# Patient Record
Sex: Male | Born: 1965
Health system: Southern US, Community
[De-identification: ages and names within clinical notes are randomized; demographics above are authoritative.]

## PROBLEM LIST (undated history)

## (undated) DIAGNOSIS — E876 Hypokalemia: Secondary | ICD-10-CM

## (undated) DIAGNOSIS — I10 Essential (primary) hypertension: Secondary | ICD-10-CM

## (undated) HISTORY — DX: Hypokalemia: E87.6

## (undated) HISTORY — PX: OTHER SURGICAL HISTORY: SHX169

## (undated) HISTORY — DX: Essential (primary) hypertension: I10

---

## 1997-12-14 ENCOUNTER — Emergency Department (HOSPITAL_COMMUNITY): Admission: EM | Admit: 1997-12-14 | Discharge: 1997-12-14 | Payer: Self-pay | Admitting: Emergency Medicine

## 2005-05-17 ENCOUNTER — Ambulatory Visit (HOSPITAL_BASED_OUTPATIENT_CLINIC_OR_DEPARTMENT_OTHER): Admission: RE | Admit: 2005-05-17 | Discharge: 2005-05-17 | Payer: Self-pay | Admitting: General Surgery

## 2008-03-30 ENCOUNTER — Emergency Department (HOSPITAL_COMMUNITY): Admission: EM | Admit: 2008-03-30 | Discharge: 2008-03-31 | Payer: Self-pay | Admitting: Emergency Medicine

## 2010-09-26 NOTE — Consult Note (Signed)
Nathan Ortega, Nathan Ortega NO.:  [id number]   MEDICAL RECORD NO.:  [id number]          PATIENT TYPE:  EMS   LOCATION:  [facility]                         FACILITY:  [facility]   PHYSICIAN:  Vania Rea, M.D. DATE OF BIRTH:  [birth year]   DATE OF CONSULTATION:  03/31/2008  DATE OF DISCHARGE:                                 CONSULTATION   REQUESTING PHYSICIAN:  Billee Cashing, M.D.   REASON FOR CONSULTATION:  Mental status changes.   HISTORY OF PRESENT ILLNESS:  This is a 45 year old Caucasian gentleman  who denies any significant medical history but has not had recent  medical followup.  Was in his baseline state of health until this  evening while after returning to work to get some extra work done.  Suddenly became confused.  Was talking to his wife on the phone and did  not know where he was or what he was doing at his location.  The  confusion continued for a few minutes, then he reverted back to normal.  He had no focal neurologic signs.  He had no visual disturbance.  There  was no nausea or vomiting.   Patient says he usually keeps good health.  He plays soccer regularly.  He has no history of head trauma.  He does report that 2-3 times over  the past 12 months he has had episodes where his vision seems to get  blurred, and he has a strange feeling of dizziness, as the best thing he  can describe it.  Usually only lasts for a few seconds.  Patient has  been brought to the hospital.  It has been noted he has returned  completely to normal.   PAST MEDICAL HISTORY:  None.   MEDICATIONS:  None.  Denies any over-the-counter or herbal medications.   ALLERGIES:  No known drug allergies.   SOCIAL HISTORY:  Denies tobacco or illicit drug use.  He very  occasionally takes a drink of alcohol.  Works as a Firefighter.   FAMILY HISTORY:  Significant for hypertension and CABG in his father and  his younger brother has diabetes.  No other family members with  diabetes.   REVIEW OF SYSTEMS:  Other than noted above, a 10-point review of systems  is unremarkable.   PHYSICAL EXAMINATION:  A healthy, muscular-looking young Caucasian  gentleman lying in the stretcher in no acute distress.  VITALS:  Initially, his temperature was 97.2, pulse 63, respirations 18,  blood pressure 161/103.  He was saturating 100% on room air.  He  received 0.3 mg of clonidine and currently, his blood pressure is  119/81.  His pulse is 52.  Pupils are equal, round and reactive.  Mucous membranes are pink and  anicteric.  He is not dehydrated.  He has no cervical lymphadenopathy or thyromegaly.  He has no carotid  bruit.  CHEST:  Clear to auscultation bilaterally.  CARDIOVASCULAR:  Regular rhythm without murmurs.  ABDOMEN:  Soft and nontender.  EXTREMITIES:  Without edema.  His muscle tone and bulk is good.  CENTRAL NERVOUS SYSTEM:  Cranial nerves II-XII  are grossly intact.  He  has no focal neurological deficit.   LABS:  CBC is unremarkable.  White count is 6.3, hemoglobin 17,  platelets 200.  He has a normal differential.  His serum chemistry is  remarkable for a potassium of 3.3, otherwise unremarkable.  His glucose  is 92.  His alcohol level is 6.  His cardiac enzymes are completely  normal.   His CT scan of the brain shows no acute abnormality.   His EKG shows sinus rhythm with a rate of 52.  Minimal voltage criteria,  LVH.   ASSESSMENT:  1. Transient global amnesia of short duration.  2. Hypertension of unclear duration.  3. Hypokalemia.   I have discussed this patient's transient amnesia with the neurologist  on call, Dr. Orlin Hilding, who feels he can be followed up as an outpatient.   PLAN:  We will give this gentleman a dose of oral potassium and  discharge him home with oral antihypertensives, to follow up with his  primary care physician.  This gentleman indicates that he has some  affiliation with [facility], and he will be following up with  Dr.  Neva Seat or Dr. Chevis Pretty.      Vania Rea, M.D.  Electronically Signed     LC/MEDQ  D:  03/31/2008  T:  03/31/2008  Job:  [id number]   cc:   Dr. Chevis Pretty  Dr. Neva Seat

## 2010-09-29 NOTE — Op Note (Signed)
NAMESADIEL, MOTA                 ACCOUNT NO.:  [id number]   MEDICAL RECORD NO.:  [id number]          PATIENT TYPE:  AMB   LOCATION:  [facility]                         FACILITY:  [facility]   PHYSICIAN:  Leonie Man, M.D.   DATE OF BIRTH:  [birth year]   DATE OF PROCEDURE:  05/17/2005  DATE OF DISCHARGE:  05/17/2005                                 OPERATIVE REPORT   PREOPERATIVE DIAGNOSIS:  Anal fistula.   POSTOPERATIVE DIAGNOSIS:  Anal fistula.   PROCEDURE:  Fistulotomy and marsupialization   SURGEON:  Leonie Man, M.D.   ASSISTANT:  None.   ANESTHESIA:  General.   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 He has returned now with an anal fistula. On  probing of the fistula, this does not go across the sphincteric muscles to  any degree on examination. The fistula is above the equatorial plane  anteriorly in the lithotomy position. The patient comes to the operating  room now for fistulotomy.   DESCRIPTION OF PROCEDURE:  Following the induction of satisfactory general  anesthesia, the patient is placed in the lithotomy position and the perianal  tissues were prepped and draped, to be included in a sterile operative  field. I entered a probe into the fistula and injected some peroxide into  the fistulous tract. The fistula was noted to enter the anus just at the  region of the dentate line. After infiltrating the area around fistula with  0.25% Marcaine, the fistula was opened. There was an anterior branch going  up and then the main branch of the fistula entered directly into the anus.  This was opened up in its entirety and curetted clean. All hemostasis was  obtained with electrocautery; and the edges of the fistula tract was sewn  down to the skin with a running 3-0 chromic catgut suture.  I packed the  incision with Xeroform gauze, and applied a sterile dressing.   The patient was then recovered and removed from  the operating room to the  recovery room in stable condition. He tolerated the procedure well.      Leonie Man, M.D.  Electronically Signed     PB/MEDQ  D:  06/05/2005  T:  06/05/2005  Job:  [id number]

## 2011-02-14 LAB — BASIC METABOLIC PANEL
BUN: 11
Calcium: 9.7
Creatinine, Ser: 1.08
GFR calc Af Amer: >60
Glucose, Bld: 92
Sodium: 141

## 2011-02-14 LAB — ETHANOL: Alcohol, Ethyl (B): 6

## 2011-02-14 LAB — RAPID URINE DRUG SCREEN, HOSP PERFORMED
Barbiturates: NOT DETECTED
Benzodiazepines: NOT DETECTED
Cocaine: NOT DETECTED
Tetrahydrocannabinol: NOT DETECTED

## 2011-02-14 LAB — CBC
MCHC: 35
Platelets: 200
RDW: 12.8

## 2011-02-14 LAB — DIFFERENTIAL
Basophils Relative: 1
Monocytes Absolute: 0.6
Monocytes Relative: 9

## 2011-02-14 LAB — POCT CARDIAC MARKERS: Myoglobin, poc: 64.7

## 2013-01-22 ENCOUNTER — Other Ambulatory Visit: Payer: Self-pay | Admitting: Orthopedic Surgery

## 2013-01-22 DIAGNOSIS — M25561 Pain in right knee: Secondary | ICD-10-CM

## 2013-01-23 ENCOUNTER — Other Ambulatory Visit: Payer: Self-pay

## 2013-01-23 ENCOUNTER — Ambulatory Visit
Admission: RE | Admit: 2013-01-23 | Discharge: 2013-01-23 | Disposition: A | Discharge status: Home / Self Care | Payer: 59 | Source: Ambulatory Visit | Attending: Orthopedic Surgery | Admitting: Orthopedic Surgery

## 2013-01-23 DIAGNOSIS — M25561 Pain in right knee: Secondary | ICD-10-CM

## 2013-11-04 ENCOUNTER — Encounter: Payer: Self-pay | Admitting: Cardiology

## 2013-11-04 ENCOUNTER — Encounter: Payer: Self-pay | Admitting: *Deleted

## 2013-11-04 DIAGNOSIS — E876 Hypokalemia: Secondary | ICD-10-CM | POA: Insufficient documentation

## 2013-11-04 DIAGNOSIS — I10 Essential (primary) hypertension: Secondary | ICD-10-CM | POA: Insufficient documentation

## 2015-10-19 ENCOUNTER — Other Ambulatory Visit: Payer: Self-pay | Admitting: Orthopedic Surgery

## 2015-10-19 DIAGNOSIS — R531 Weakness: Secondary | ICD-10-CM

## 2015-10-19 DIAGNOSIS — M25561 Pain in right knee: Secondary | ICD-10-CM

## 2015-10-19 DIAGNOSIS — R609 Edema, unspecified: Secondary | ICD-10-CM

## 2015-10-25 ENCOUNTER — Ambulatory Visit
Admission: RE | Admit: 2015-10-25 | Discharge: 2015-10-25 | Disposition: A | Discharge status: Home / Self Care | Payer: 59 | Source: Ambulatory Visit | Attending: Orthopedic Surgery | Admitting: Orthopedic Surgery

## 2015-10-25 DIAGNOSIS — R609 Edema, unspecified: Secondary | ICD-10-CM

## 2015-10-25 DIAGNOSIS — R531 Weakness: Secondary | ICD-10-CM

## 2015-10-25 DIAGNOSIS — M25561 Pain in right knee: Secondary | ICD-10-CM

## 2016-02-01 ENCOUNTER — Encounter: Payer: 59 | Attending: Internal Medicine | Admitting: Dietician

## 2016-02-01 DIAGNOSIS — I10 Essential (primary) hypertension: Secondary | ICD-10-CM | POA: Insufficient documentation

## 2016-02-01 DIAGNOSIS — Z713 Dietary counseling and surveillance: Secondary | ICD-10-CM | POA: Insufficient documentation

## 2016-02-01 DIAGNOSIS — E785 Hyperlipidemia, unspecified: Secondary | ICD-10-CM | POA: Diagnosis not present

## 2016-02-01 NOTE — Progress Notes (Signed)
  Medical Nutrition Therapy:  Appt start time: 0950 end time:  1100.   Assessment:  Primary concerns today: Nathan Ortega is here today since he has a hx high blood pressure and 2 months ago it was 190/120 when he wasn't taking his medication. Has been off and on medication for the past few years. Started taking medication again at the end of July. Sometimes will have migraines with high blood pressure. Has made a lot of changes to his diet in the past month. Would like to come up with a meal plan that works for him. Used to go out to eat for lunch a lot. Has been eating mostly salad, yogurt, and fruit for lunch at work.   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 Lives with his wife, 48 year old son, and 2 sons in college who are not at home. Oldest son fends for himself for food. His wife does the food shopping and meal preparation at home. Usually does not eat breakfast during the week. Eating out 1-2 meals per week. Was eating out most days for lunch fast food and [employer].   Has cut back on salt with cooking.   Had ACL surgery 3 years ago and was playing soccer before.   Preferred Learning Style:   No preference indicated   Learning Readiness:   Ready   MEDICATIONS: see list   DIETARY INTAKE:  Usual eating pattern includes 2-3 meals and 2 snacks per day.  Avoided foods include some fish, mushrooms, liver     24-hr recall:  B ( AM): none or frosted mini wheats/honey nut or oatmeal on weekends  Snk ( AM): none  L ( PM): salads with vegetables and vinaigrette or Newman's own and non fat [place] yogurt and sometimes blueberries  Snk ( PM): banana or lightly salted peanuts or triscuits  D ( PM): grilled chicken or steak or fish or tilapia with salad and fruit Snk ( PM): banana or lightly salted peanuts or triscuits  Beverages: coffee and water  Usual physical activity: gym inconsistent a few nights per week, walks at night  Estimated energy needs: 2000 calories 225 g  carbohydrates 150 g protein 56 g fat  Progress Towards Goal(s):  In progress.   Nutritional Diagnosis:  NB-1.1 Food and nutrition-related knowledge deficit As related to hypertension.  As evidenced by hx of eating excessive sodium containing foods.    Intervention:  Nutrition counseling provided. Plan: Try unsalted peanut butter  Fat from plants (avocado, olive oil, seeds, nuts) are more heart healthy Try 2% yogurt.  Try Olive oil store for salad ideas.  Limit egg yolks to no more than 7 per week.  Choose lower sodium lunch meat. Plan to make lunch at night. Add protein to salad - egg, lower sodium lunch meat, lower sodium beans, grilled meat.  Teaching Method Utilized:  Visual Auditory Hands on  Handouts given during visit include:  Hypertension Nutrition Therapy Handout   Barriers to learning/adherence to lifestyle change: none  Demonstrated degree of understanding via:  Teach Back   Monitoring/Evaluation:  Dietary intake, exercise, and body weight prn.

## 2016-02-01 NOTE — Patient Instructions (Addendum)
Try unsalted peanut butter  Fat from plants (avocado, olive oil, seeds, nuts) are more heart healthy Try 2% yogurt.  Try Olive oil store for salad ideas.  Limit egg yolks to no more than 7 per week.  Choose lower sodium lunch meat. Plan to make lunch at night. Add protein to salad - egg, lower sodium lunch meat, lower sodium beans, grilled meat.

## 2017-11-03 IMAGING — MR MR KNEE*R* W/O CM
4 of 6 series · 18 of 40 positions shown · non-contrast
Comparison: MRI 01/23/2013.

CLINICAL DATA: Knee pain with swelling and instability after
bowling 2 months ago. Previous ACL reconstruction in 2900.

EXAM:
MRI OF THE RIGHT KNEE WITHOUT CONTRAST
TECHNIQUE: Multiplanar, multisequence MR imaging of the knee was performed. No
intravenous contrast was administered.

[Series 3: PD fat-sat · axial · 4.0mm · 0.31mm/px · z∈[-57,+48]mm · 8 of 25 slices shown (1 of 4)]
[im 1/25]
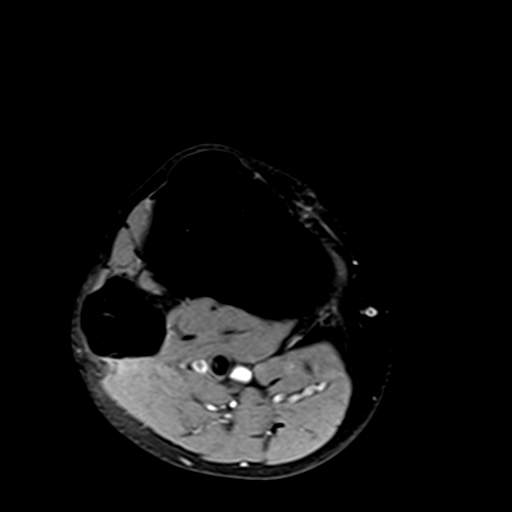
[im 4/25]
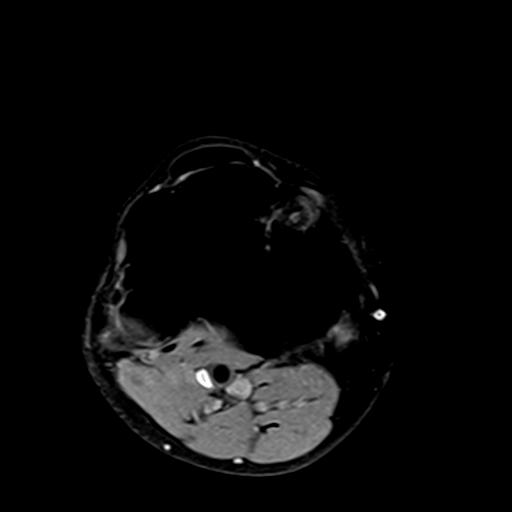
[im 7/25]
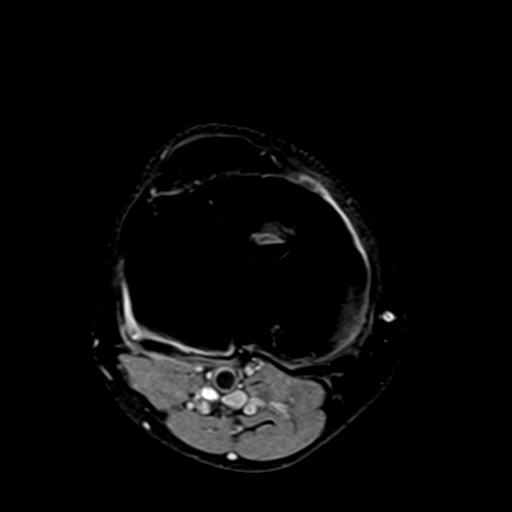
[im 11/25]
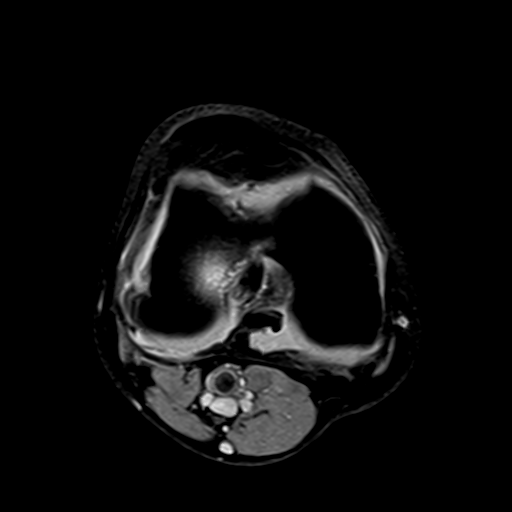
[im 14/25]
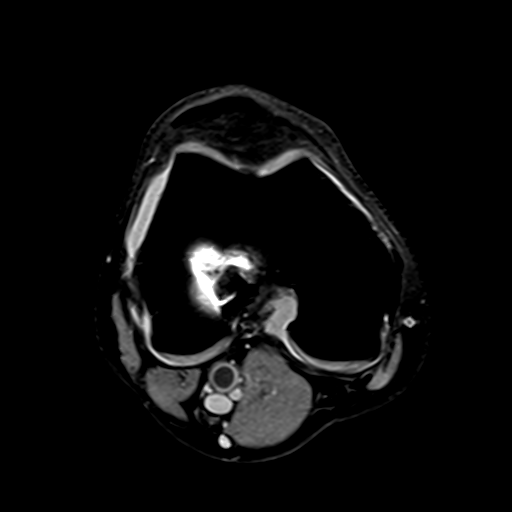
[im 18/25]
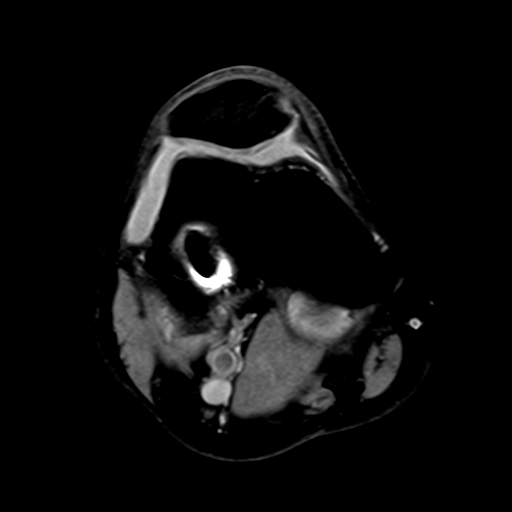
[im 21/25]
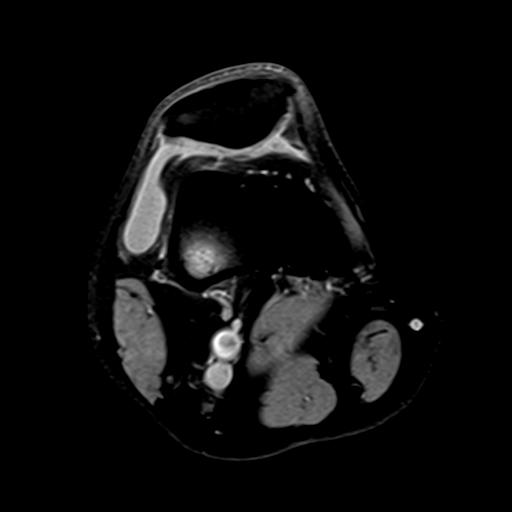
[im 25/25]
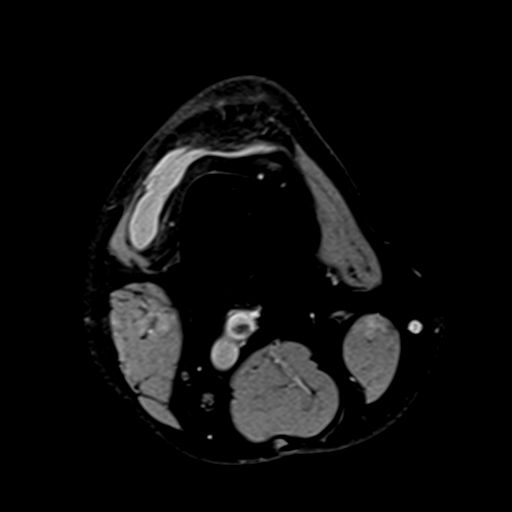

[Series 6: PD fat-sat · coronal · 3.5mm · 0.31mm/px · 4 of 25 slices shown (2 of 4)]
[im 1/25]
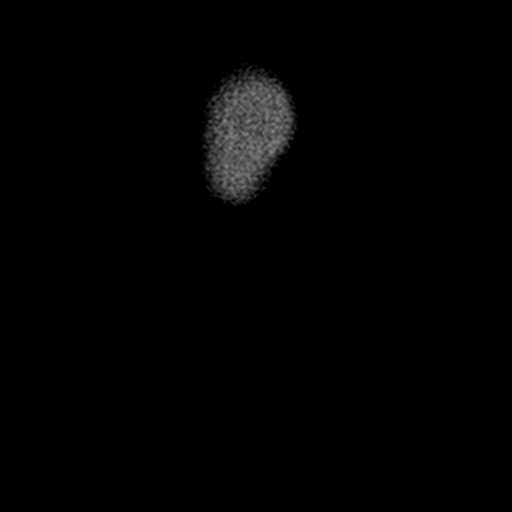
[im 5/25]
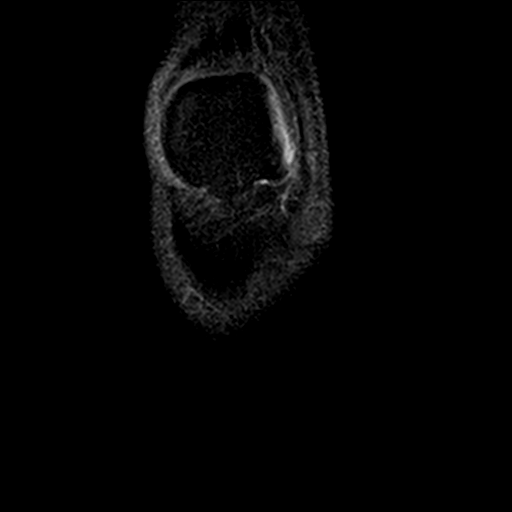
[im 13/25]
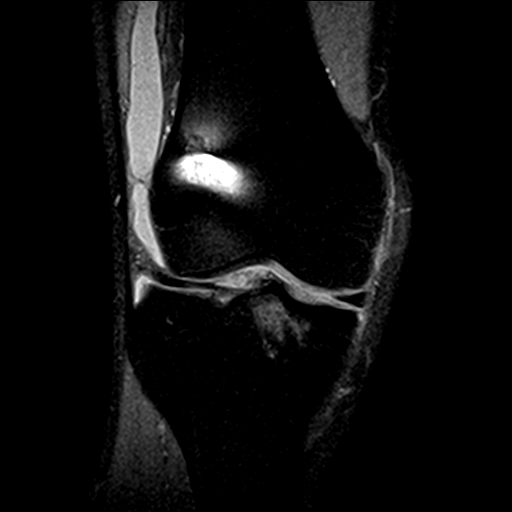
[im 21/25]
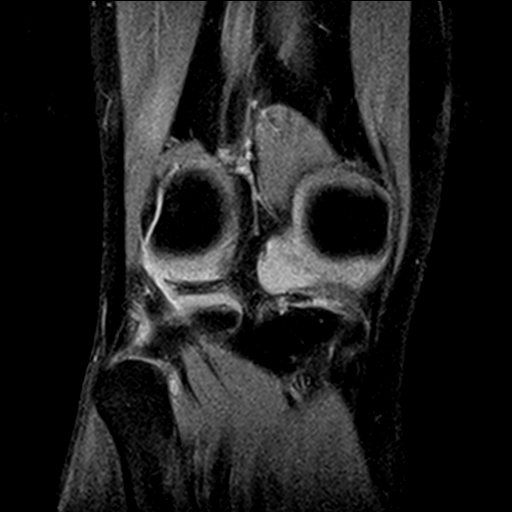

[Series 7: PD fat-sat · sagittal · 3.5mm · 0.31mm/px · 3 of 27 slices shown (3 of 4)]
[im 4/27]
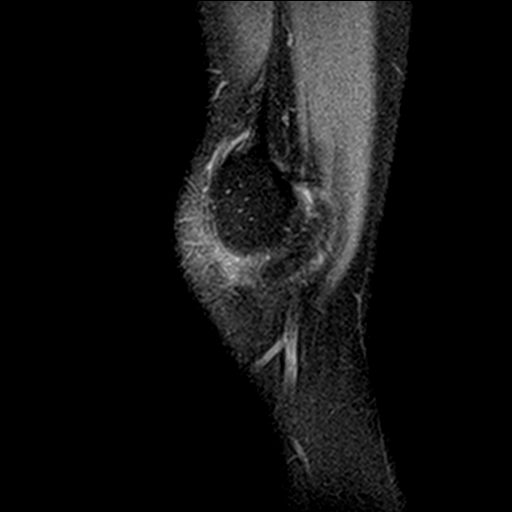
[im 15/27]
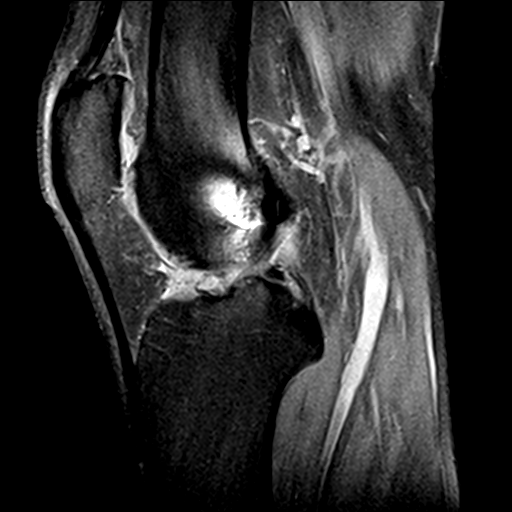
[im 23/27]
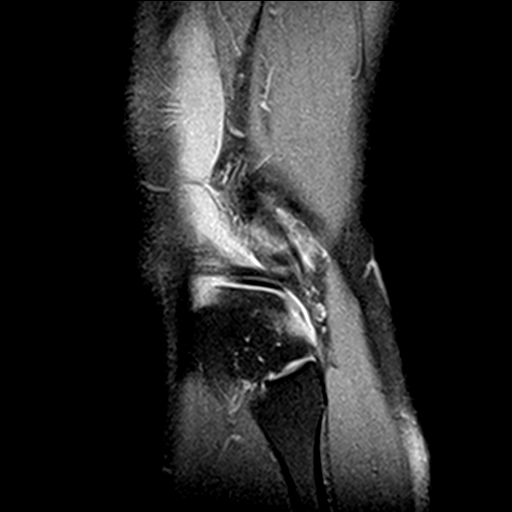

[Series 8: PD fat-sat · coronal · 2.0mm · 0.29mm/px · 3 of 11 slices shown (4 of 4)]
[im 1/11]
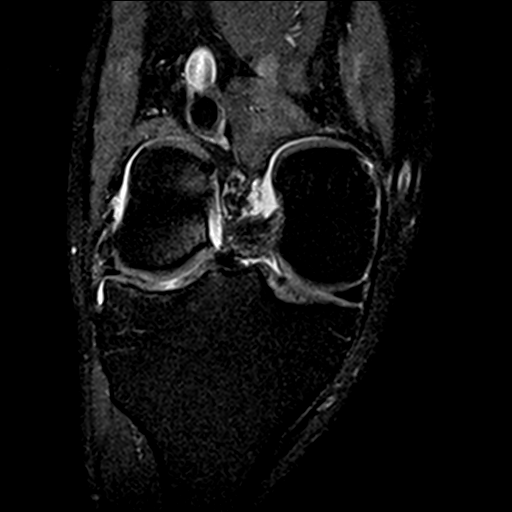
[im 6/11]
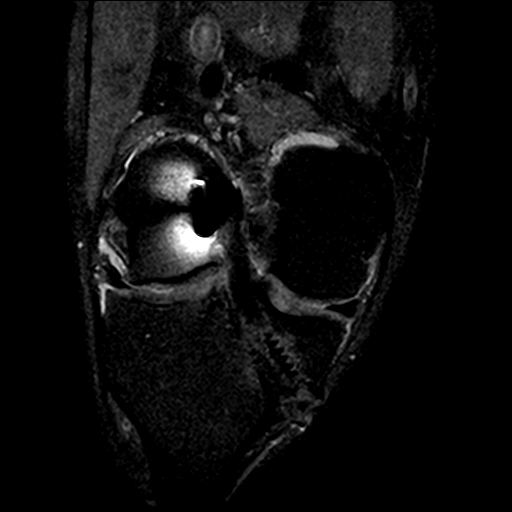
[im 11/11]
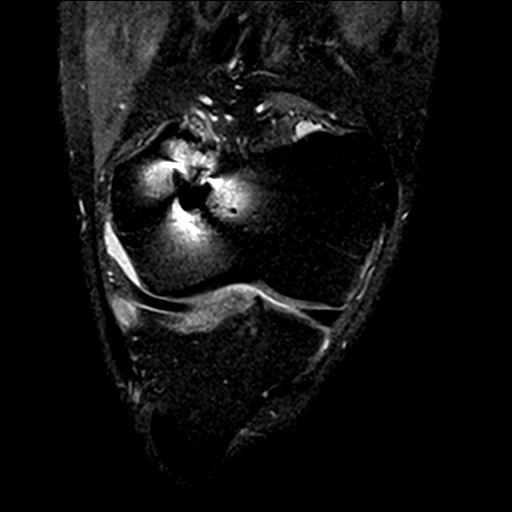

[18 of 40 positions shown; findings below may reference images not displayed]

FINDINGS: MENISCI

Medial meniscus:  Intact with normal morphology.

Lateral meniscus: Interval debridement of bucket-handle tear
previously demonstrated. No recurrent meniscal tear or displaced
meniscal fragment identified.

LIGAMENTS

Cruciates: Interval ACL reconstruction. The ACL graft is intact. The
PCL is intact with mild posterior buckling.

Collaterals:  Intact.

CARTILAGE

Patellofemoral: Multifocal patellofemoral chondral thinning and
surface irregularity, similar to the previous examination.

Medial:  Stable mild chondral thinning without focal defect.

Lateral: There is focal full-thickness chondral thinning over the
central aspect of the lateral tibial plateau, best seen on coronal
images. No significant subchondral signal abnormalities.

OTHER

Joint: Small to moderate joint effusion. There is an apparent
fragmented osteophyte of the medial tibial plateau posteriorly
accounting for a suggested loose body on axial image 18 and coronal
image 5.

Popliteal Fossa:  Unremarkable. No significant Baker's cyst.

Extensor Mechanism: Intact. The patellar tendon appears normal, not
utilized for the ACL graft.

Bones:  No acute or significant extra-articular osseous findings.
IMPRESSION: 1. No acute findings or evidence of internal derangement.
2. The ACL graft is intact status post interval ACL reconstruction.
3. No evidence of recurrent meniscal tear following debridement of
previously demonstrated lateral meniscal bucket-handle tear.
4. Tricompartmental degenerative chondrosis, similar to previous
study. There is a full-thickness chondral defect over the central
aspect of the lateral tibial plateau. There is fragmented spurring
of the medial tibial plateau posteriorly.

## 2020-01-21 ENCOUNTER — Ambulatory Visit (INDEPENDENT_AMBULATORY_CARE_PROVIDER_SITE_OTHER): Payer: 59 | Admitting: Mental Health

## 2020-01-21 ENCOUNTER — Other Ambulatory Visit: Payer: Self-pay

## 2020-01-21 DIAGNOSIS — F4322 Adjustment disorder with anxiety: Secondary | ICD-10-CM

## 2020-01-21 NOTE — Progress Notes (Signed)
[facility] Initial Adult Exam  Name: Nathan Ortega Date: 01/21/2020 MRN: [id number] DOB: 1965-08-16 PCP: Nathan Noble, MD  Time spent: 53 minutes  Reason for Visit /Presenting Problem: Patient stated he and his wife had their 21 year anniversary. He stated she caught him viewing pictures of women online via social media. The anniversary trip difficult on the way home, had car problems which made the trip home even more difficult. She now feels he is untrustworthy. He stated she has been having some "male issues" which made her feel like this is a reason why he engaged in the behavior, which lasted for a year period. Currently, she remains disgusted and upset w/ his behavior. He views himself as more of a risk taker She knows he avoids conflict and is not always direct enough. He struggles to be fully honest about his feelings.   Mental Status Exam:   Appearance:   Casual     Behavior:  Appropriate  Motor:  Normal  Speech/Language:   Clear/coherant  Affect:  Full range  Mood:  Anxious, pleasant  Thought process:  normal  Thought content:    WNL  Sensory/Perceptual disturbances:    WNL  Orientation:  x4  Attention:  Good  Concentration:  Good  Memory:  WNL  Fund of knowledge:   Good  Insight:    Good  Judgment:   Good  Impulse Control:  Good   Reported Symptoms:  Some anxiety and sad feelings  Risk Assessment: Danger to Self:  No Self-injurious Behavior: No Danger to Others: No Duty to Warn:no Physical Aggression / Violence:No  Access to Firearms a concern: No  Gang Involvement:No  Patient / guardian was educated about steps to take if suicide or homicide risk level increases between visits: yes While future psychiatric events cannot be accurately predicted, the patient does not currently require acute inpatient psychiatric care and does not currently meet [facility] involuntary commitment criteria.  Substance Abuse History: Current substance abuse: No      Past Psychiatric History:   Outpatient Providers: none History of Psych Hospitalization: No  Psychological Testing: none  Abuse History: Victim -none Report needed: No. Victim of Neglect:No. Perpetrator of none  Witness / Exposure to Domestic Violence: No   Protective Services Involvement: No  Witness to [employer] Violence:  No    Medical History/Surgical History:  Past Medical History:  Diagnosis Date  . HTN (hypertension)   . Hypokalemia      Medications: Current Outpatient Medications  Medication Sig Dispense Refill  . amLODipine (NORVASC) 10 MG tablet Take 15 mg by mouth daily.    Marland Kitchen triamterene-hydrochlorothiazide (MAXZIDE-25) 37.5-25 MG tablet Take 1 tablet by mouth daily.     No current facility-administered medications for this visit.    Not on File  Diagnoses:    ICD-10-CM   1. Adjustment disorder with anxious mood  F43.22     Plan of Care: TBD   Nathan Ortega, [facility]

## 2020-02-09 ENCOUNTER — Other Ambulatory Visit: Payer: Self-pay

## 2020-02-09 ENCOUNTER — Ambulatory Visit (INDEPENDENT_AMBULATORY_CARE_PROVIDER_SITE_OTHER): Payer: 59 | Admitting: Mental Health

## 2020-02-09 DIAGNOSIS — F4322 Adjustment disorder with anxiety: Secondary | ICD-10-CM | POA: Diagnosis not present

## 2020-02-09 NOTE — Progress Notes (Signed)
[facility] Psychotherapy Note   Name: Nathan Ortega Date: 02/09/2020 MRN: [id number] DOB: [birth year] PCP: Marden Noble, MD  Time spent: 53 minutes  Treatment: individual therapy  Mental Status Exam:   Appearance:   Casual     Behavior:  Appropriate  Motor:  Normal  Speech/Language:   Clear/coherant  Affect:  Full range  Mood:  Anxious, pleasant  Thought process:  normal  Thought content:    WNL  Sensory/Perceptual disturbances:    WNL  Orientation:  x4  Attention:  Good  Concentration:  Good  Memory:  WNL  Fund of knowledge:   Good  Insight:    Good  Judgment:   Good  Impulse Control:  Good   Reported Symptoms:  Some anxiety and sad feelings  Risk Assessment: Danger to Self:  No Self-injurious Behavior: No Danger to Others: No Duty to Warn:no Physical Aggression / Violence:No  Access to Firearms a concern: No  Gang Involvement:No  Patient / guardian was educated about steps to take if suicide or homicide risk level increases between visits: yes While future psychiatric events cannot be accurately predicted, the patient does not currently require acute inpatient psychiatric care and does not currently meet [facility] involuntary commitment criteria.  Substance Abuse History: Current substance abuse: No     Past Psychiatric History:   Outpatient Providers: none History of Psych Hospitalization: No  Psychological Testing: none  Abuse History: Victim -none Report needed: No. Victim of Neglect:No. Perpetrator of none  Witness / Exposure to Domestic Violence: No   Protective Services Involvement: No  Witness to [employer] Violence:  No    Medical History/Surgical History:  Past Medical History:  Diagnosis Date  . HTN (hypertension)   . Hypokalemia      Medications: Current Outpatient Medications  Medication Sig Dispense Refill  . amLODipine (NORVASC) 10 MG tablet Take 15 mg by mouth daily.    Marland Kitchen triamterene-hydrochlorothiazide (MAXZIDE-25)  37.5-25 MG tablet Take 1 tablet by mouth daily.     No current facility-administered medications for this visit.    Family History: Raised by both parents. 1 brother- age 63. Pleasant childhood. Father -hx of etoh /opioid abuse. Has been sober for almost 10 years.  Close to his parents.  Works w/ his father.   Social History:  Social History   Socioeconomic History  . Marital status: Married    Spouse name: Not on file  . Number of children: Not on file  . Years of education: Not on file  . Highest education level: Not on file  Occupational History  . Not on file  Tobacco Use  . Smoking status: Not on file  Substance and Sexual Activity  . Alcohol use: Not on file  . Drug use: Not on file  . Sexual activity: Not on file  Other Topics Concern  . Not on file  Social History Narrative  . Not on file   Social Determinants of Health   Financial Resource Strain:   . Difficulty of Paying Living Expenses: Not on file  Food Insecurity:   . Worried About Programme researcher, broadcasting/film/video in the Last Year: Not on file  . Ran Out of Food in the Last Year: Not on file  Transportation Needs:   . Lack of Transportation (Medical): Not on file  . Lack of Transportation (Non-Medical): Not on file  Physical Activity:   . Days of Exercise per Week: Not on file  . Minutes of Exercise per Session: Not on file  Stress:   . Feeling of Stress : Not on file  Social Connections:   . Frequency of Communication with Friends and Family: Not on file  . Frequency of Social Gatherings with Friends and Family: Not on file  . Attends Religious Services: Not on file  . Active Member of Clubs or Organizations: Not on file  . Attends Banker Meetings: Not on file  . 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  He stated his wife told he she is still mad at him; they are working on building trust in the relationship again.  He stated that they communicate day-to-day with one another but there continues to be relational strain due to his not being honest with her about some of his online activities as discussed initially.  He continues to report he has refrained from these behaviors and went on to share some personality trait differences between him and his wife.  He verbalized understanding why she is upset with him and that his choices were not reflective of something he is proud of or would do again.  Through guided discovery, he verbalized his difficulty at times with identifying and processing his feelings openly.  This is evident in various relationships but also present in his marital relationship at times.  Facilitated his identifying some thoughts and feelings related to how he was feeling in their relationship which was in part connected to some of his choices that led to his online behaviors.  He was able to identify the benefit of potentially communicating with his wife some of these thoughts and feelings at the right time.  Challenged him to consider what he believes and/or tells himself that has been a catalyst for the suppression of feelings at times.  He plans to give  this thought and consideration between sessions.  Interventions: Assessment, CBT, supportive therapy  Diagnoses:    ICD-10-CM   1. Adjustment disorder with anxious mood  F43.22     Plan: Patient is to use CBT, mindfulness and coping skills to  Decrease anxiety.    Patient to continue to maintain abstinence from viewing online material with which he and for his wife might consider inappropriate.   Long-term goal:   Reduce overall level, frequency, and intensity of the feelings of anxiety.  Patient to rebuild trust with his wife in their marital relationship.   Short-term goal:  Maintain honesty in his marital relationship to build trust again. Improve communication in his marital relationship evidenced by sharing thoughts and feelings as opposed to suppressing them Increase insight toward further understanding thoughts and behaviors which led to a decrease in trust in his marital relationship  Assessment of progress:  progressing   Waldron Session, [facility]

## 2020-03-08 ENCOUNTER — Ambulatory Visit: Payer: 59 | Admitting: Mental Health

## 2020-03-30 ENCOUNTER — Ambulatory Visit (INDEPENDENT_AMBULATORY_CARE_PROVIDER_SITE_OTHER): Payer: 59 | Admitting: Mental Health

## 2020-03-30 ENCOUNTER — Other Ambulatory Visit: Payer: Self-pay

## 2020-03-30 DIAGNOSIS — F4322 Adjustment disorder with anxiety: Secondary | ICD-10-CM | POA: Diagnosis not present

## 2020-03-30 NOTE — Progress Notes (Signed)
[facility] Psychotherapy Note   Name: Nathan Ortega Date: 03/30/2020 MRN: [id number] DOB: 10-Jan-1966 PCP: Marden Noble, MD  Time spent: 53 minutes  Treatment: individual therapy  Mental Status Exam:   Appearance:   Casual     Behavior:  Appropriate  Motor:  Normal  Speech/Language:   Clear/coherant  Affect:  Full range  Mood:  Anxious, pleasant  Thought process:  normal  Thought content:    WNL  Sensory/Perceptual disturbances:    WNL  Orientation:  x4  Attention:  Good  Concentration:  Good  Memory:  WNL  Fund of knowledge:   Good  Insight:    Good  Judgment:   Good  Impulse Control:  Good   Reported Symptoms:  Some anxiety and sad feelings  Risk Assessment: Danger to Self:  No Self-injurious Behavior: No Danger to Others: No Duty to Warn:no Physical Aggression / Violence:No  Access to Firearms a concern: No  Gang Involvement:No  Patient / guardian was educated about steps to take if suicide or homicide risk level increases between visits: yes While future psychiatric events cannot be accurately predicted, the patient does not currently require acute inpatient psychiatric care and does not currently meet [facility] involuntary commitment criteria. Medications: Current Outpatient Medications  Medication Sig Dispense Refill  . amLODipine (NORVASC) 10 MG tablet Take 15 mg by mouth daily.    Marland Kitchen triamterene-hydrochlorothiazide (MAXZIDE-25) 37.5-25 MG tablet Take 1 tablet by mouth daily.     No current facility-administered medications for this visit.    Subjective: Patient presents for session on time.  He shared progress, recent events.  He stated his wife is been under increased pressure recently, increased stress due to her job.  He went on to detail a situation with which she is coping with at length as well as his attempts to provide support to her during this time.  He feels that the relationship has continued to improve over the past few weeks,  albeit she has been largely focused on her job stress.  Through guided discovery, he identified the need to have a more focused discussion about some of the relationship issues with which he feels he has been responsible.  He plans to take ownership of some of his behaviors and we explore collaboratively ways he can communicate his feelings as this is been challenging for him in the past.  He identified how also making this effort would also illustrate to his wife and for himself that he is working to be more communicative of his feelings.   Interventions: Solution focused approaches, supportive therapy  Diagnoses:    ICD-10-CM   1. Adjustment disorder with anxious mood  F43.22     Plan: Patient is to use CBT, mindfulness and coping skills to  Decrease anxiety.    Patient to continue to maintain abstinence from viewing online material with which he and for his wife might consider inappropriate.   Long-term goal:   Reduce overall level, frequency, and intensity of the feelings of anxiety.  Patient to rebuild trust with his wife in their marital relationship.   Short-term goal:  Maintain honesty in his marital relationship to build trust again. Improve communication in his marital relationship evidenced by sharing thoughts and feelings as opposed to suppressing them Increase insight toward further understanding thoughts and behaviors which led to a decrease in trust in his marital relationship   Assessment of progress:  progressing   Waldron Session, [facility]

## 2020-04-28 ENCOUNTER — Ambulatory Visit: Payer: 59 | Admitting: Mental Health

## 2021-03-21 ENCOUNTER — Encounter (HOSPITAL_BASED_OUTPATIENT_CLINIC_OR_DEPARTMENT_OTHER): Payer: Self-pay | Admitting: Cardiovascular Disease

## 2021-03-21 ENCOUNTER — Ambulatory Visit (INDEPENDENT_AMBULATORY_CARE_PROVIDER_SITE_OTHER): Payer: 59 | Admitting: Cardiovascular Disease

## 2021-03-21 ENCOUNTER — Other Ambulatory Visit: Payer: Self-pay

## 2021-03-21 VITALS — BP 128/86 | HR 70 | Ht 70.0 in | Wt 170.7 lb

## 2021-03-21 DIAGNOSIS — I10 Essential (primary) hypertension: Secondary | ICD-10-CM | POA: Diagnosis not present

## 2021-03-21 DIAGNOSIS — I739 Peripheral vascular disease, unspecified: Secondary | ICD-10-CM

## 2021-03-21 DIAGNOSIS — E7800 Pure hypercholesterolemia, unspecified: Secondary | ICD-10-CM | POA: Diagnosis not present

## 2021-03-21 DIAGNOSIS — R55 Syncope and collapse: Secondary | ICD-10-CM | POA: Insufficient documentation

## 2021-03-21 DIAGNOSIS — H532 Diplopia: Secondary | ICD-10-CM

## 2021-03-21 HISTORY — DX: Pure hypercholesterolemia, unspecified: E78.00

## 2021-03-21 HISTORY — DX: Peripheral vascular disease, unspecified: I73.9

## 2021-03-21 HISTORY — DX: Syncope and collapse: R55

## 2021-03-21 HISTORY — DX: Diplopia: H53.2

## 2021-03-21 MED ORDER — AMLODIPINE BESYLATE 10 MG PO TABS: 10.0000 mg | ORAL_TABLET | Freq: Every day | ORAL | 3 refills | Status: DC

## 2021-03-21 NOTE — Assessment & Plan Note (Signed)
He has made significant lifestyle changes in the last 3 months.  We will check fasting lipids and a CMP today.  Given that he has PAD, his LDL goal should be less than 70.  We discussed the fact that it is very likely we will recommend a statin and he expressed understanding.

## 2021-03-21 NOTE — Patient Instructions (Addendum)
Medication Instructions:  INCREASE AMLODIPINE TO 10 MG DAILY   STOP METOPROLOL   *If you need a refill on your cardiac medications before your next appointment, please call your pharmacy*  Lab Work: LP/CMET TODAY   If you have labs (blood work) drawn today and your tests are completely normal, you will receive your results only by: MyChart Message (if you have MyChart) OR A paper copy in the mail If you have any lab test that is abnormal or we need to change your treatment, we will call you to review the results.  Testing/Procedures: Your physician has requested that you have a carotid duplex. This test is an ultrasound of the carotid arteries in your neck. It looks at blood flow through these arteries that supply the brain with blood. Allow one hour for this exam. There are no restrictions or special instructions.  Follow-Up: At [facility], you and your health needs are our priority.  As part of our continuing mission to provide you with exceptional heart care, we have created designated Provider Care Teams.  These Care Teams include your primary Cardiologist (physician) and Advanced Practice Providers (APPs -  Physician Assistants and Nurse Practitioners) who all work together to provide you with the care you need, when you need it.  We recommend signing up for the patient portal called "MyChart".  Sign up information is provided on this After Visit Summary.  MyChart is used to connect with patients for Virtual Visits (Telemedicine).  Patients are able to view lab/test results, encounter notes, upcoming appointments, etc.  Non-urgent messages can be sent to your provider as well.   To learn more about what you can do with MyChart, go to [email or web address].    Your next appointment:   2 month(s)  The format for your next appointment:   In Person  Provider:   DR Duke Salvia   You have been referred to NEUROLOGY   Where: [facility] Address: [address] [place] [address] Phone: [phone]  IF YOU DO NOT HEAR FROM THEM YOU CAN CALL THE OFFICE DIRECTLY AT NUMBER LISTED ABOVE  Other Instructions MONITOR YOUR BLOOD PRESSURE DAILY   BRING YOUR READINGS AND MACHINE TO YOUR FOLLOW UP APPOINTMENT

## 2021-03-21 NOTE — Assessment & Plan Note (Signed)
He has random episodes of diplopia.  He is also had transient global amnesia at least twice.  He had a stat head CT but no carotid Dopplers.  We will check carotid Dopplers.  I will also have him see neurology to see if they have any other work-up recommendations.

## 2021-03-21 NOTE — Assessment & Plan Note (Signed)
He had an isolated episode of syncope that occurred after making significant changes to his diet.  I suspect this was related to intravascular volume depletion.  He has no symptoms lately and there were no preceding symptoms such as chest pain, palpitations, or shortness of breath.  We will continue to monitor for now.

## 2021-03-21 NOTE — Assessment & Plan Note (Signed)
BP has been more elevated at home.  Better in the office today than usual.  He is interested on being on his few medicines as possible.  He has been seen at [facility] for renal denervation study.  He is not yet sure if he will be enrolled.  We discussed the fact that there are no likely secondary causes for his hypertension.  He has a healthy body weight and exercises regularly.  He is also been working on his diet.  He snores but has no other symptoms of sleep apnea.  He thinks his thyroid has been checked with his PCP.  CT showed no renal artery stenosis and no concerns for adrenal adenomas.  We will try to simplify his regimen by increasing amlodipine to 10 mg and stopping metoprolol.  He will start tracking his blood pressures regularly and bring that to follow-up.  We will also ask him to bring his machine to make sure that it is accurate.  Continue working on diet and exercise.

## 2021-03-21 NOTE — Assessment & Plan Note (Addendum)
Calcification of the iliacs noted on CT 11/2020.  He has no claudication and has good peripheral pulses.  LDL goal should be less than 70.

## 2021-03-21 NOTE — Progress Notes (Signed)
Cardiology Office Note:    Date:  03/21/2021   ID:  Nathan Ortega, DOB 1965/12/01, MRN [id number]  PCP:  Marden Noble, MD   [facility] Providers Cardiologist:  None     Referring MD: Marden Noble, MD   No chief complaint on file.   History of Present Illness:    Nathan Ortega is a 55 y.o. male with a hx of hypertension, hyperlipidemia, and hypokalemia, here for evaluation of history of LVH, hypertension, and aortic root dilation. He had a CT abdomen/pelvis 11/2020 that showed mild atherosclerosis in the iliac arteries.  Today, he feels his blood pressure has been elevated for a long time and has been difficult to control with medication. After a previous episode of global transient amnesia (2009), he was initially placed on antihypertensives. Metoprolol was added in the past year. Previously he was on an ace inhibitor but this caused a severe cough. At home his blood pressure is as high as 160/90-100. He does not monitor this regularly, and is unsure of his average blood pressure. However, during clinic visits he usually has readings in the 140s-160s. Of note, he reports having a fainting spell this past September. After having steak and one alcoholic drink at dinner, he began to "feel horrible" with associated nausea. He then passed out on the way home. At this time he denies any recurrent syncopal episodes, but does have similar pre-syncopal episodes that occur about every 3-4 weeks. He experiences "blanking out", double vision, and a feeling of "everything spinning around him" for a duration of up to 3 minutes. This may occur while sitting at the office, walking his dog, or driving. About a month ago his neck began hurting. For about a week he was unable to turn his head to the side, but this has currently resolved. For exercise he walks every day, and enjoys hiking on the weekends. Generally he feels fine aside from shortness of breath with steep inclines. In the past 3 months he has  incorporated more dietary changes and is working on increasing his exercise. He has been moving towards a plant-based diet with more fruits and vegetables, but has meat about once a week. At home he does not add salt to his meals. He formerly smoked while in college. Now he may have a cigar about 3-4 times a year, but he does not otherwise smoke. He endorses snoring, but feels great when he wakes up in the morning. Of note, he is unaware of any issues concerning an enlarged aorta. He denies any palpitations, or chest pain. No orthopnea, PND, or lower extremity edema.   Past Medical History:  Diagnosis Date   Diplopia 03/21/2021   HTN (hypertension)    Hypokalemia    PAD (peripheral artery disease) (HCC) 03/21/2021   Pure hypercholesterolemia 03/21/2021   Syncope and collapse 03/21/2021    Past Surgical History:  Procedure Laterality Date   Fistulotomy and marsupialization      Current Medications: Current Meds  Medication Sig   triamterene-hydrochlorothiazide (MAXZIDE-25) 37.5-25 MG tablet Take 1 tablet by mouth daily.   [DISCONTINUED] amLODipine (NORVASC) 10 MG tablet Take 10 mg by mouth daily.   [DISCONTINUED] amLODipine (NORVASC) 5 MG tablet Take 5 mg by mouth daily.   [DISCONTINUED] metoprolol succinate (TOPROL-XL) 25 MG 24 hr tablet Take 25 mg by mouth daily.     Allergies:   Lisinopril and Olmesartan   Social History   Socioeconomic History   Marital status: Married    Spouse name: Not on  file   Number of children: Not on file   Years of education: Not on file   Highest education level: Not on file  Occupational History   Not on file  Tobacco Use   Smoking status: Some Days    Types: Cigarettes, Cigars   Smokeless tobacco: Never  Substance and Sexual Activity   Alcohol use: Yes    Comment: Occasionally   Drug use: Never   Sexual activity: Not on file  Other Topics Concern   Not on file  Social History Narrative   Not on file   Social Determinants of Health    Financial Resource Strain: Low Risk    Difficulty of Paying Living Expenses: Not hard at all  Food Insecurity: No Food Insecurity   Worried About Running Out of Food in the Last Year: Never true   Ran Out of Food in the Last Year: Never true  Transportation Needs: No Transportation Needs   Lack of Transportation (Medical): No   Lack of Transportation (Non-Medical): No  Physical Activity: Sufficiently Active   Days of Exercise per Week: 6 days   Minutes of Exercise per Session: 50 min  Stress: Not on file  Social Connections: Not on file     Family History: The patient's family history includes Heart attack (age of onset: 81) in his father; Heart disease in his paternal grandfather and paternal grandmother.  ROS:   Please see the history of present illness.    (+) Exertional shortness of breath  (+) Double vision (+) Dizziness (+) Snoring (+) Syncope (+) Nausea All other systems reviewed and are negative.  EKGs/Labs/Other Studies Reviewed:    The following studies were reviewed today:  CTA Abdomen ([facility]) 11/09/2020: There are several small periesophageal and gastrohepatic ligament nodes  which are subcentimeter in size and are likely reactive.  Mild atherosclerotic changes noted. No significant stenosis of the celiac,  SMA or renal arteries noted. The IMA is patent. Atherosclerotic changes  also noted in the iliacs. There are bilateral small 2 to 4 mm  nonobstructing renal stones. Lung bases and bones are unremarkable   CT angiogram of the abdomen as described above without significant  narrowing or stenosis. Note is made of nonobstructing bilateral small  renal stones  EKG:    03/21/2021: Sinus rhythm. Rate 70 bpm.   Recent Labs: No results found for requested labs within last 8760 hours.   Recent Lipid Panel No results found for: CHOL, TRIG, HDL, CHOLHDL, VLDL, LDLCALC, LDLDIRECT       Physical Exam:    Wt Readings from Last 3  Encounters:  03/21/21 170 lb 11.2 oz (77.4 kg)  02/01/16 160 lb 8 oz (72.8 kg)     VS:  BP 128/86 (BP Location: Left Arm, Patient Position: Sitting, Cuff Size: Normal)   Pulse 70   Ht 5\' 10"  (1.778 m)   Wt 170 lb 11.2 oz (77.4 kg)   BMI 24.49 kg/m  , BMI Body mass index is 24.49 kg/m. GENERAL:  Well appearing HEENT: Pupils equal round and reactive, fundi not visualized, oral mucosa unremarkable NECK:  No jugular venous distention, waveform within normal limits, carotid upstroke brisk and symmetric, no bruits, no thyromegaly LUNGS:  Clear to auscultation bilaterally HEART:  RRR.  PMI not displaced or sustained,S1 and S2 within normal limits, no S3, no S4, no clicks, no rubs, no murmurs ABD:  Flat, positive bowel sounds normal in frequency in pitch, no bruits, no rebound, no guarding,  no midline pulsatile mass, no hepatomegaly, no splenomegaly EXT:  2 plus pulses throughout, no edema, no cyanosis no clubbing SKIN:  No rashes no nodules NEURO:  Cranial nerves II through XII grossly intact, motor grossly intact throughout PSYCH:  Cognitively intact, oriented to person place and time   ASSESSMENT:    1. Double vision   2. Pure hypercholesterolemia   3. PAD (peripheral artery disease) (HCC)   4. Diplopia   5. Syncope and collapse   6. Primary hypertension    PLAN:    PAD (peripheral artery disease) (HCC) Calcification of the iliacs noted on CT 11/2020.  He has no claudication and has good peripheral pulses.  LDL goal should be less than 70.  HTN (hypertension) BP has been more elevated at home.  Better in the office today than usual.  He is interested on being on his few medicines as possible.  He has been seen at [facility] for renal denervation study.  He is not yet sure if he will be enrolled.  We discussed the fact that there are no likely secondary causes for his hypertension.  He has a healthy body weight and exercises regularly.  He is also been working on his diet.  He snores  but has no other symptoms of sleep apnea.  He thinks his thyroid has been checked with his PCP.  CT showed no renal artery stenosis and no concerns for adrenal adenomas.  We will try to simplify his regimen by increasing amlodipine to 10 mg and stopping metoprolol.  He will start tracking his blood pressures regularly and bring that to follow-up.  We will also ask him to bring his machine to make sure that it is accurate.  Continue working on diet and exercise.  Pure hypercholesterolemia He has made significant lifestyle changes in the last 3 months.  We will check fasting lipids and a CMP today.  Given that he has PAD, his LDL goal should be less than 70.  We discussed the fact that it is very likely we will recommend a statin and he expressed understanding.  Diplopia He has random episodes of diplopia.  He is also had transient global amnesia at least twice.  He had a stat head CT but no carotid Dopplers.  We will check carotid Dopplers.  I will also have him see neurology to see if they have any other work-up recommendations.  Syncope and collapse He had an isolated episode of syncope that occurred after making significant changes to his diet.  I suspect this was related to intravascular volume depletion.  He has no symptoms lately and there were no preceding symptoms such as chest pain, palpitations, or shortness of breath.  We will continue to monitor for now.    Disposition: FU with Alder Murri C. Duke Salvia, MD, [facility] in 2 months.  Medication Adjustments/Labs and Tests Ordered: Current medicines are reviewed at length with the patient today.  Concerns regarding medicines are outlined above.   Orders Placed This Encounter  Procedures   Lipid panel   Comprehensive metabolic panel   Ambulatory referral to Neurology   EKG 12-Lead   VAS US CAROTID     Meds ordered this encounter  Medications   amLODipine (NORVASC) 10 MG tablet    Sig: Take 1 tablet (10 mg total) by mouth daily.     Dispense:  90 tablet    Refill:  3    D/C 5 MG RX     Patient Instructions  Medication Instructions:  INCREASE AMLODIPINE TO 10 MG DAILY   STOP METOPROLOL   *If you need a refill on your cardiac medications before your next appointment, please call your pharmacy*  Lab Work: LP/CMET TODAY   If you have labs (blood work) drawn today and your tests are completely normal, you will receive your results only by: MyChart Message (if you have MyChart) OR A paper copy in the mail If you have any lab test that is abnormal or we need to change your treatment, we will call you to review the results.  Testing/Procedures: Your physician has requested that you have a carotid duplex. This test is an ultrasound of the carotid arteries in your neck. It looks at blood flow through these arteries that supply the brain with blood. Allow one hour for this exam. There are no restrictions or special instructions.  Follow-Up: At [facility], you and your health needs are our priority.  As part of our continuing mission to provide you with exceptional heart care, we have created designated Provider Care Teams.  These Care Teams include your primary Cardiologist (physician) and Advanced Practice Providers (APPs -  Physician Assistants and Nurse Practitioners) who all work together to provide you with the care you need, when you need it.  We recommend signing up for the patient portal called "MyChart".  Sign up information is provided on this After Visit Summary.  MyChart is used to connect with patients for Virtual Visits (Telemedicine).  Patients are able to view lab/test results, encounter notes, upcoming appointments, etc.  Non-urgent messages can be sent to your provider as well.   To learn more about what you can do with MyChart, go to [email or web address].    Your next appointment:   2 month(s)  The format for your next appointment:   In Person  Provider:   DR [facility]   You have been  referred to NEUROLOGY   Where: [facility] Address: [address] [place] [address] Phone: [phone]  IF YOU DO NOT HEAR FROM THEM YOU CAN CALL THE OFFICE DIRECTLY AT NUMBER LISTED ABOVE  Other Instructions MONITOR YOUR BLOOD PRESSURE DAILY   BRING YOUR READINGS AND MACHINE TO YOUR FOLLOW UP APPOINTMENT     I,Mathew Stumpf,acting as a scribe for Chilton Si, MD.,have documented all relevant documentation on the behalf of Chilton Si, MD,as directed by  Chilton Si, MD while in the presence of Chilton Si, MD.  I, Sharifa Bucholz C. Duke Salvia, MD have reviewed all documentation for this visit.  The documentation of the exam, diagnosis, procedures, and orders on 03/21/2021 are all accurate and complete.   Signed, Chilton Si, MD  03/21/2021 1:06 PM    [facility]

## 2021-03-22 ENCOUNTER — Encounter (HOSPITAL_BASED_OUTPATIENT_CLINIC_OR_DEPARTMENT_OTHER): Payer: Self-pay

## 2021-03-22 LAB — COMPREHENSIVE METABOLIC PANEL
ALT: 21 IU/L (ref 0–44)
AST: 22 IU/L (ref 0–40)
Albumin/Globulin Ratio: 1.6 (ref 1.2–2.2)
Albumin: 4.6 g/dL (ref 3.8–4.9)
Alkaline Phosphatase: 80 IU/L (ref 44–121)
BUN/Creatinine Ratio: 13 (ref 9–20)
BUN: 14 mg/dL (ref 6–24)
Bilirubin Total: 0.4 mg/dL (ref 0.0–1.2)
CO2: 28 mmol/L (ref 20–29)
Calcium: 10 mg/dL (ref 8.7–10.2)
Chloride: 99 mmol/L (ref 96–106)
Creatinine, Ser: 1.07 mg/dL (ref 0.76–1.27)
Globulin, Total: 2.8 g/dL (ref 1.5–4.5)
Glucose: 96 mg/dL (ref 70–99)
Potassium: 4.9 mmol/L (ref 3.5–5.2)
Sodium: 144 mmol/L (ref 134–144)
Total Protein: 7.4 g/dL (ref 6.0–8.5)
eGFR: 83 mL/min/{1.73_m2} (ref >59)

## 2021-03-22 LAB — LIPID PANEL
Chol/HDL Ratio: 5 ratio (ref 0.0–5.0)
Cholesterol, Total: 239 mg/dL — ABNORMAL HIGH (ref 100–199)
HDL: 48 mg/dL (ref >39)
LDL Chol Calc (NIH): 167 mg/dL — ABNORMAL HIGH (ref 0–99)
Triglycerides: 131 mg/dL (ref 0–149)
VLDL Cholesterol Cal: 24 mg/dL (ref 5–40)

## 2021-03-31 ENCOUNTER — Ambulatory Visit (INDEPENDENT_AMBULATORY_CARE_PROVIDER_SITE_OTHER): Payer: 59

## 2021-03-31 ENCOUNTER — Other Ambulatory Visit: Payer: Self-pay

## 2021-03-31 DIAGNOSIS — R55 Syncope and collapse: Secondary | ICD-10-CM

## 2021-04-24 ENCOUNTER — Telehealth (HOSPITAL_BASED_OUTPATIENT_CLINIC_OR_DEPARTMENT_OTHER): Payer: Self-pay | Admitting: *Deleted

## 2021-04-24 NOTE — Telephone Encounter (Signed)
Left message to call back  

## 2021-04-24 NOTE — Telephone Encounter (Signed)
-----   Message from Chilton Si, MD sent at 04/23/2021  2:06 PM EST ----- Minimal blockage in the carotid arteries.

## 2021-04-24 NOTE — Telephone Encounter (Signed)
-----   Message from Chilton Si, MD sent at 04/23/2021  2:06 PM EST ----- Cholesterol levels are very elevated.  This raises his risk of heart attack and stroke and makes it more likely that the blockages in his legs will get worse.  Recommend starting rosuvastatin 40mg .  Repeat lipids/CMP in 2-3 months.  Normal kidney function and liver function.

## 2021-04-26 ENCOUNTER — Ambulatory Visit (INDEPENDENT_AMBULATORY_CARE_PROVIDER_SITE_OTHER): Payer: 59 | Admitting: Neurology

## 2021-04-26 ENCOUNTER — Encounter: Payer: Self-pay | Admitting: Neurology

## 2021-04-26 VITALS — BP 140/85 | HR 72 | Ht 70.0 in | Wt 169.0 lb

## 2021-04-26 DIAGNOSIS — G43109 Migraine with aura, not intractable, without status migrainosus: Secondary | ICD-10-CM

## 2021-04-26 DIAGNOSIS — H532 Diplopia: Secondary | ICD-10-CM

## 2021-04-26 NOTE — Patient Instructions (Signed)
Continue with Aleve/Ibuprofen as needed for the episodic migraines  MRI Brain with and without contrast  Follow up in 6 months or sooner if worse

## 2021-04-26 NOTE — Progress Notes (Signed)
[facility]  PATIENT: Nathan Ortega DOB: 07-31-1965  REQUESTING CLINICIAN: Skeet Latch, MD HISTORY FROM: Patient REASON FOR VISIT: Headaches and double vision   HISTORICAL  CHIEF COMPLAINT:  Chief Complaint  Patient presents with   New Patient (Initial Visit)    Rm 13. Alone. NP Internal referral for double vision/episodes of presyncope? Pt c/o "floaters" in vision that is accompanied by headache. He c/o dull neckache that is constant since the beginning of October.    HISTORY OF PRESENT ILLNESS:  This is a 55 year old gentleman with past medical history of hypertension who is presenting with complaint of double vision and floaters.  He reported floaters that he described as seeing a large black spot in his vision usually start on the right side of his vision and moved to the left. This floater/black spot will last about 30 minutes and afterward he will have headaches.  This has been going on for the past 15 years.  He has never been treated or evaluated for this.  For the headaches, he will take NSAID with relief of the headaches.  He will have these episodes once every 2 to 35-monthand sometimes he will have it a couple times a month.  She denies any nausea, vomiting photophobia photophobia with the headaches reported as soon as he sees black spot, he will take Aleve therefore headaches will not be as bad. Patient also reported a history of double vision that will improve with closing one eyes (left or right).  He denies any history of head trauma there are no headaches or weakness associated with the double vision.  This has been going on for the past year.  He also report episode of vertigo, last one was this year, vertigo described as spinning sensation lasting for few minutes.    He has also noted on neck pain since the beginning of this month ongoing pain described as dull pain almost all day but reported improvement of the pain and in his range of motion.     OTHER MEDICAL CONDITIONS: HTN   REVIEW OF SYSTEMS: Full 14 system review of systems performed and negative with exception of: as noted in the HPI   ALLERGIES: Allergies  Allergen Reactions   Lisinopril Cough   Olmesartan Cough    HOME MEDICATIONS: Outpatient Medications Prior to Visit  Medication Sig Dispense Refill   amLODipine (NORVASC) 10 MG tablet Take 1 tablet (10 mg total) by mouth daily. 90 tablet 3   triamterene-hydrochlorothiazide (MAXZIDE-25) 37.5-25 MG tablet Take 1 tablet by mouth daily.     No facility-administered medications prior to visit.    PAST MEDICAL HISTORY: Past Medical History:  Diagnosis Date   Diplopia 03/21/2021   HTN (hypertension)    Hypokalemia    PAD (peripheral artery disease) ([place] 03/21/2021   Pure hypercholesterolemia 03/21/2021   Syncope and collapse 03/21/2021    PAST SURGICAL HISTORY: Past Surgical History:  Procedure Laterality Date   Fistulotomy and marsupialization      FAMILY HISTORY: Family History  Problem Relation Age of Onset   Heart attack Father 429  Heart disease Paternal Grandmother    Heart disease Paternal Grandfather     SOCIAL HISTORY: Social History   Socioeconomic History   Marital status: Married    Spouse name: Not on file   Number of children: Not on file   Years of education: Not on file   Highest education level: Not on file  Occupational History   Not on file  Tobacco Use   Smoking status: Some Days    Types: Cigarettes, Cigars   Smokeless tobacco: Never  Substance and Sexual Activity   Alcohol use: Yes    Comment: Occasionally   Drug use: Never   Sexual activity: Not on file  Other Topics Concern   Not on file  Social History Narrative   Not on file   Social Determinants of Health   Financial Resource Strain: Low Risk    Difficulty of Paying Living Expenses: Not hard at all  Food Insecurity: No Food Insecurity   Worried About Charity fundraiser in the Last Year: Never true    Ran Out of Food in the Last Year: Never true  Transportation Needs: No Transportation Needs   Lack of Transportation (Medical): No   Lack of Transportation (Non-Medical): No  Physical Activity: Sufficiently Active   Days of Exercise per Week: 6 days   Minutes of Exercise per Session: 50 min  Stress: Not on file  Social Connections: Not on file  Intimate Partner Violence: Not on file     PHYSICAL EXAM  GENERAL EXAM/CONSTITUTIONAL: Vitals:  Vitals:   04/26/21 0928  BP: 140/85  Pulse: 72  Weight: 169 lb (76.7 kg)  Height: 5' 10"  (1.778 m)   Body mass index is 24.25 kg/m. Wt Readings from Last 3 Encounters:  04/26/21 169 lb (76.7 kg)  03/21/21 170 lb 11.2 oz (77.4 kg)  02/01/16 160 lb 8 oz (72.8 kg)   Patient is in no distress; well developed, nourished and groomed; neck is supple  CARDIOVASCULAR: Examination of carotid arteries is normal; no carotid bruits Regular rate and rhythm, no murmurs Examination of peripheral vascular system by observation and palpation is normal  EYES: Pupils round and reactive to light, Visual fields full to confrontation, Extraocular movements intacts,   MUSCULOSKELETAL: Gait, strength, tone, movements noted in Neurologic exam below  NEUROLOGIC: MENTAL STATUS:  No flowsheet data found. awake, alert, oriented to person, place and time recent and remote memory intact normal attention and concentration language fluent, comprehension intact, naming intact fund of knowledge appropriate  CRANIAL NERVE:  2nd - no papilledema or hemorrhages on fundoscopic exam 2nd, 3rd, 4th, 6th - pupils equal and reactive to light, visual fields full to confrontation, extraocular muscles intact, no nystagmus 5th - facial sensation symmetric 7th - facial strength symmetric 8th - hearing intact 9th - palate elevates symmetrically, uvula midline 11th - shoulder shrug symmetric 12th - tongue protrusion midline  MOTOR:  normal bulk and tone, full strength  in the BUE, BLE  SENSORY:  normal and symmetric to light touch, pinprick, temperature, vibration  COORDINATION:  finger-nose-finger, fine finger movements normal  REFLEXES:  deep tendon reflexes present and symmetric  GAIT/STATION:  normal     DIAGNOSTIC DATA (LABS, IMAGING, TESTING) - I reviewed patient records, labs, notes, testing and imaging myself where available.  Lab Results  Component Value Date   WBC 6.3 03/30/2008   HGB 17.1 (H) 03/30/2008   HCT 49.0 03/30/2008   MCV 89.8 03/30/2008   PLT 200 03/30/2008      Component Value Date/Time   NA 144 03/21/2021 0939   K 4.9 03/21/2021 0939   CL 99 03/21/2021 0939   CO2 28 03/21/2021 0939   GLUCOSE 96 03/21/2021 0939   GLUCOSE 92 03/30/2008 2233   BUN 14 03/21/2021 0939   CREATININE 1.07 03/21/2021 0939   CALCIUM 10.0 03/21/2021 0939   PROT 7.4 03/21/2021 0939   ALBUMIN 4.6 03/21/2021  0939   AST 22 03/21/2021 0939   ALT 21 03/21/2021 0939   ALKPHOS 80 03/21/2021 0939   BILITOT 0.4 03/21/2021 0939   GFRNONAA >60 03/30/2008 2233   GFRAA  03/30/2008 2233    >60        The eGFR has been calculated using the MDRD equation. This calculation has not been validated in all clinical   Lab Results  Component Value Date   CHOL 239 (H) 03/21/2021   HDL 48 03/21/2021   LDLCALC 167 (H) 03/21/2021   TRIG 131 03/21/2021   CHOLHDL 5.0 03/21/2021   No results found for: HGBA1C No results found for: VITAMINB12 No results found for: TSH    ASSESSMENT AND PLAN  55 y.o. year old male with history of hypertension, who is presenting with episode of what he describes as floaters/black spot moving from his right right side of his vision all the way to the left followed by headaches.  This is consistent with migraine with auras.  He will have 1 episode every couple months on average therefore episodic.  I will not start him on preventive medication but advise him to continue with Aleve/ibuprofen since it has been effective.   Because of age and new complaint of double vision I will obtain a MRI brain with and without contrast for further work up of his double vision. He has seen OPT and was cleared.  I will contact the patient to go over the results otherwise I will see him in 6 months for follow-up.  Return sooner if worse.   1. Migraine with aura and without status migrainosus, not intractable   2. Double vision      PLAN: Continue with Aleve/Ibuprofen as needed for the episodic migraines  MRI Brain with and without contrast  Follow up in 6 months or sooner if worse   Orders Placed This Encounter  Procedures   MR BRAIN W WO CONTRAST    No orders of the defined types were placed in this encounter.   Return in about 3 months (around 07/25/2021).    Alric Ran, MD 04/26/2021, 1:14 PM  [facility] [address] [place], [place] [address] [phone]

## 2021-05-01 ENCOUNTER — Other Ambulatory Visit (HOSPITAL_BASED_OUTPATIENT_CLINIC_OR_DEPARTMENT_OTHER): Payer: Self-pay

## 2021-05-01 DIAGNOSIS — Z79899 Other long term (current) drug therapy: Secondary | ICD-10-CM

## 2021-05-01 MED ORDER — ROSUVASTATIN CALCIUM 40 MG PO TABS: 40.0000 mg | ORAL_TABLET | Freq: Every day | ORAL | 3 refills | Status: DC

## 2021-05-03 ENCOUNTER — Telehealth: Payer: Self-pay | Admitting: Neurology

## 2021-05-03 NOTE — Telephone Encounter (Signed)
[employer] pending uploaded notes on the portal  

## 2021-05-10 NOTE — Telephone Encounter (Signed)
Parke Poisson, RN  05/01/2021  2:57 PM EST     Pt updated and verbalized understanding.

## 2021-05-10 NOTE — Telephone Encounter (Signed)
MR Brain w/wo contrast Dr. San Morelle [facility] Berkley Harvey: [id number] (exp. 05/04/21 to 06/18/21). Patient is scheduled at [facility] or 05/24/21.

## 2021-05-24 ENCOUNTER — Other Ambulatory Visit: Payer: 59

## 2021-05-26 ENCOUNTER — Encounter (HOSPITAL_BASED_OUTPATIENT_CLINIC_OR_DEPARTMENT_OTHER): Payer: Self-pay | Admitting: Cardiovascular Disease

## 2021-05-26 ENCOUNTER — Other Ambulatory Visit: Payer: Self-pay

## 2021-05-26 ENCOUNTER — Ambulatory Visit (INDEPENDENT_AMBULATORY_CARE_PROVIDER_SITE_OTHER): Payer: 59 | Admitting: Cardiovascular Disease

## 2021-05-26 VITALS — BP 118/80 | HR 78 | Ht 70.0 in | Wt 169.4 lb

## 2021-05-26 DIAGNOSIS — E7800 Pure hypercholesterolemia, unspecified: Secondary | ICD-10-CM

## 2021-05-26 DIAGNOSIS — I10 Essential (primary) hypertension: Secondary | ICD-10-CM | POA: Diagnosis not present

## 2021-05-26 DIAGNOSIS — I700 Atherosclerosis of aorta: Secondary | ICD-10-CM

## 2021-05-26 DIAGNOSIS — Z79899 Other long term (current) drug therapy: Secondary | ICD-10-CM

## 2021-05-26 DIAGNOSIS — I739 Peripheral vascular disease, unspecified: Secondary | ICD-10-CM

## 2021-05-26 HISTORY — DX: Atherosclerosis of aorta: I70.0

## 2021-05-26 NOTE — Patient Instructions (Signed)
Medication Instructions:  Your physician recommends that you continue on your current medications as directed. Please refer to the Current Medication list given to you today.   *If you need a refill on your cardiac medications before your next appointment, please call your pharmacy*  Lab Work: FASTING LP/CMET IN 2 MONTHS  If you have labs (blood work) drawn today and your tests are completely normal, you will receive your results only by: [place] (if you have MyChart) OR A paper copy in the mail If you have any lab test that is abnormal or we need to change your treatment, we will call you to review the results.  Testing/Procedures: NONE  Follow-Up: At [facility], you and your health needs are our priority.  As part of our continuing mission to provide you with exceptional heart care, we have created designated Provider Care Teams.  These Care Teams include your primary Cardiologist (physician) and Advanced Practice Providers (APPs -  Physician Assistants and Nurse Practitioners) who all work together to provide you with the care you need, when you need it.  We recommend signing up for the patient portal called "MyChart".  Sign up information is provided on this After Visit Summary.  MyChart is used to connect with patients for Virtual Visits (Telemedicine).  Patients are able to view lab/test results, encounter notes, upcoming appointments, etc.  Non-urgent messages can be sent to your provider as well.   To learn more about what you can do with MyChart, go to [email or web address].    Your next appointment:   12 month(s)  The format for your next appointment:   In Person  Provider:   Skeet Latch, MD

## 2021-05-26 NOTE — Assessment & Plan Note (Signed)
Lipids were poorly controlled so he was started on rosuvastatin.  He is tolerating it well and exercising regularly.  Repeat lipids and a CMP in a couple months.

## 2021-05-26 NOTE — Assessment & Plan Note (Addendum)
Atherosclerosis noted in the iliac arteries on CT at [facility] 11/2020.  He was started on rosuvastatin.  LDL goal less than 70.  He has good peripheral pulses and no claudication.

## 2021-05-26 NOTE — Assessment & Plan Note (Signed)
Blood pressures well controlled on amlodipine and HCTZ/triamterene.  Continue with regular exercise.

## 2021-05-26 NOTE — Progress Notes (Signed)
Cardiology Office Note:    Date:  05/26/2021   ID:  Nathan Ortega, DOB 05-29-1965, MRN [id number]  PCP:  Josetta Huddle, MD   [place] Providers Cardiologist:  None     Referring MD: Josetta Huddle, MD   No chief complaint on file.   History of Present Illness:    Nathan Ortega is a 56 y.o. male with a hx of hypertension, hyperlipidemia, and hypokalemia, here for evaluation of history of LVH, hypertension, and aortic root dilation. He had a CT abdomen/pelvis 11/2020 that showed mild atherosclerosis in the iliac arteries. After a previous episode of global transient amnesia (2009), he was initially placed on antihypertensives.  At his last appointment his blood pressure was elevated.  Amlodipine was increased and metoprolol was discontinued.  Lipids were checked and his LDL was extremely elevated so he was started on rosuvastatin.  He went to [facility] to consider renal denervation but was never enrolled.  Overall he has been doing well.  He does not check his blood pressures regularly at home.  He exercises regularly with walking and hiking and has no exertional symptoms.  He denies chest pain, shortness of breath, lower extremity edema, orthopnea, or PND.  He denies claudication.  Past Medical History:  Diagnosis Date   Aortic atherosclerosis ([place]) 05/26/2021   Diplopia 03/21/2021   HTN (hypertension)    Hypokalemia    PAD (peripheral artery disease) ([place]) 03/21/2021   Pure hypercholesterolemia 03/21/2021   Syncope and collapse 03/21/2021    Past Surgical History:  Procedure Laterality Date   Fistulotomy and marsupialization      Current Medications: Current Meds  Medication Sig   amLODipine (NORVASC) 10 MG tablet Take 1 tablet (10 mg total) by mouth daily.   rosuvastatin (CRESTOR) 40 MG tablet Take 1 tablet (40 mg total) by mouth daily.   triamterene-hydrochlorothiazide (MAXZIDE-25) 37.5-25 MG tablet Take 1 tablet by mouth daily.     Allergies:   Lisinopril and Olmesartan    Social History   Socioeconomic History   Marital status: Married    Spouse name: Not on file   Number of children: Not on file   Years of education: Not on file   Highest education level: Not on file  Occupational History   Not on file  Tobacco Use   Smoking status: Some Days    Types: Cigarettes, Cigars   Smokeless tobacco: Never  Substance and Sexual Activity   Alcohol use: Yes    Comment: Occasionally   Drug use: Never   Sexual activity: Not on file  Other Topics Concern   Not on file  Social History Narrative   Not on file   Social Determinants of Health   Financial Resource Strain: Low Risk    Difficulty of Paying Living Expenses: Not hard at all  Food Insecurity: No Food Insecurity   Worried About Charity fundraiser in the Last Year: Never true   [place] in the Last Year: Never true  Transportation Needs: No Transportation Needs   Lack of Transportation (Medical): No   Lack of Transportation (Non-Medical): No  Physical Activity: Sufficiently Active   Days of Exercise per Week: 6 days   Minutes of Exercise per Session: 50 min  Stress: Not on file  Social Connections: Not on file     Family History: The patient's family history includes Heart attack (age of onset: 29) in his father; Heart disease in his paternal grandfather and paternal grandmother.  ROS:  Please see the history of present illness.    (+) Exertional shortness of breath  (+) Double vision (+) Dizziness (+) Snoring (+) Syncope (+) Nausea All other systems reviewed and are negative.  EKGs/Labs/Other Studies Reviewed:    The following studies were reviewed today:  CTA Abdomen ([place]) 11/09/2020: There are several small periesophageal and gastrohepatic ligament nodes  which are subcentimeter in size and are likely reactive.  Mild atherosclerotic changes noted. No significant stenosis of the celiac,  SMA or renal arteries noted. The IMA is patent.  Atherosclerotic changes  also noted in the iliacs. There are bilateral small 2 to 4 mm  nonobstructing renal stones. Lung bases and bones are unremarkable   CT angiogram of the abdomen as described above without significant  narrowing or stenosis. Note is made of nonobstructing bilateral small  renal stones  EKG:    03/21/2021: Sinus rhythm. Rate 70 bpm.   Recent Labs: 03/21/2021: ALT 21; BUN 14; Creatinine, Ser 1.07; Potassium 4.9; Sodium 144   Recent Lipid Panel    Component Value Date/Time   CHOL 239 (H) 03/21/2021 0939   TRIG 131 03/21/2021 0939   HDL 48 03/21/2021 0939   CHOLHDL 5.0 03/21/2021 0939   LDLCALC 167 (H) 03/21/2021 0939         Physical Exam:    Wt Readings from Last 3 Encounters:  05/26/21 169 lb 6.4 oz (76.8 kg)  04/26/21 169 lb (76.7 kg)  03/21/21 170 lb 11.2 oz (77.4 kg)     VS:  BP 118/80 (BP Location: Left Arm, Patient Position: Sitting, Cuff Size: Normal)    Pulse 78    Ht 5\' 10"  (1.778 m)    Wt 169 lb 6.4 oz (76.8 kg)    SpO2 96%    BMI 24.31 kg/m  , BMI Body mass index is 24.31 kg/m. GENERAL:  Well appearing HEENT: Pupils equal round and reactive, fundi not visualized, oral mucosa unremarkable NECK:  No jugular venous distention, waveform within normal limits, carotid upstroke brisk and symmetric, no bruits, no thyromegaly LUNGS:  Clear to auscultation bilaterally HEART:  RRR.  PMI not displaced or sustained,S1 and S2 within normal limits, no S3, no S4, no clicks, no rubs, no murmurs ABD:  Flat, positive bowel sounds normal in frequency in pitch, no bruits, no rebound, no guarding, no midline pulsatile mass, no hepatomegaly, no splenomegaly EXT:  2 plus pulses throughout, no edema, no cyanosis no clubbing SKIN:  No rashes no nodules NEURO:  Cranial nerves II through XII grossly intact, motor grossly intact throughout PSYCH:  Cognitively intact, oriented to person place and time   ASSESSMENT:    1. Pure hypercholesterolemia   2. Primary  hypertension   3. Medication management   4. PAD (peripheral artery disease) ([place])   5. Aortic atherosclerosis ([facility])     PLAN:    HTN (hypertension) Blood pressures well controlled on amlodipine and HCTZ/triamterene.  Continue with regular exercise.  Pure hypercholesterolemia Lipids were poorly controlled so he was started on rosuvastatin.  He is tolerating it well and exercising regularly.  Repeat lipids and a CMP in a couple months.  PAD (peripheral artery disease) ([facility]) Atherosclerosis noted in the iliac arteries on CT at [facility] 11/2020.  He was started on rosuvastatin.  LDL goal less than 70.  He has good peripheral pulses and no claudication.  Aortic atherosclerosis ([place]) Started on rosuvastatin as above.   Disposition: FU with Azizi Bally C. Oval Linsey, MD, [facility] in  2 months.  Medication Adjustments/Labs and Tests Ordered: Current medicines are reviewed at length with the patient today.  Concerns regarding medicines are outlined above.   Orders Placed This Encounter  Procedures   Lipid panel   Comprehensive metabolic panel     No orders of the defined types were placed in this encounter.   Signed, Skeet Latch, MD  05/26/2021 1:13 PM    [place]

## 2021-05-26 NOTE — Assessment & Plan Note (Signed)
Started on rosuvastatin as above.

## 2021-06-14 ENCOUNTER — Other Ambulatory Visit: Payer: Self-pay | Admitting: *Deleted

## 2021-06-14 MED ORDER — AMLODIPINE BESYLATE 10 MG PO TABS: 10.0000 mg | ORAL_TABLET | Freq: Every day | ORAL | 3 refills | Status: DC

## 2021-07-26 ENCOUNTER — Ambulatory Visit: Payer: 59 | Admitting: Neurology

## 2021-12-12 ENCOUNTER — Encounter (HOSPITAL_BASED_OUTPATIENT_CLINIC_OR_DEPARTMENT_OTHER): Payer: Self-pay | Admitting: *Deleted

## 2022-05-13 ENCOUNTER — Other Ambulatory Visit (HOSPITAL_BASED_OUTPATIENT_CLINIC_OR_DEPARTMENT_OTHER): Payer: Self-pay | Admitting: Cardiovascular Disease

## 2022-05-15 NOTE — Telephone Encounter (Signed)
Rx request sent to pharmacy.  

## 2022-05-16 ENCOUNTER — Other Ambulatory Visit (HOSPITAL_BASED_OUTPATIENT_CLINIC_OR_DEPARTMENT_OTHER): Payer: Self-pay | Admitting: Cardiovascular Disease

## 2022-05-16 NOTE — Telephone Encounter (Signed)
Rx(s) sent to pharmacy electronically.  

## 2022-05-21 ENCOUNTER — Telehealth (HOSPITAL_BASED_OUTPATIENT_CLINIC_OR_DEPARTMENT_OTHER): Payer: Self-pay

## 2022-05-21 MED ORDER — ROSUVASTATIN CALCIUM 40 MG PO TABS: 40.0000 mg | ORAL_TABLET | Freq: Every day | ORAL | 1 refills | Status: DC

## 2022-05-21 NOTE — Telephone Encounter (Addendum)
Received fax from [facility] requesting refills for Rosuvastatin 40 mg. Rx request sent to pharmacy.

## 2022-05-21 NOTE — Telephone Encounter (Signed)
error 

## 2022-07-30 ENCOUNTER — Other Ambulatory Visit: Payer: Self-pay | Admitting: Cardiovascular Disease

## 2022-07-30 NOTE — Telephone Encounter (Signed)
Rx(s) sent to pharmacy electronically.  

## 2022-08-21 ENCOUNTER — Ambulatory Visit (HOSPITAL_BASED_OUTPATIENT_CLINIC_OR_DEPARTMENT_OTHER): Payer: 59 | Admitting: Cardiovascular Disease

## 2022-10-02 ENCOUNTER — Encounter (HOSPITAL_BASED_OUTPATIENT_CLINIC_OR_DEPARTMENT_OTHER): Payer: Self-pay | Admitting: Family

## 2022-10-02 ENCOUNTER — Other Ambulatory Visit (HOSPITAL_BASED_OUTPATIENT_CLINIC_OR_DEPARTMENT_OTHER): Payer: Self-pay | Admitting: Family

## 2022-10-02 ENCOUNTER — Ambulatory Visit (INDEPENDENT_AMBULATORY_CARE_PROVIDER_SITE_OTHER): Payer: 59 | Admitting: Family

## 2022-10-02 VITALS — BP 132/88 | HR 74 | Ht 70.0 in | Wt 176.0 lb

## 2022-10-02 DIAGNOSIS — E785 Hyperlipidemia, unspecified: Secondary | ICD-10-CM

## 2022-10-02 DIAGNOSIS — I77810 Thoracic aortic ectasia: Secondary | ICD-10-CM | POA: Diagnosis not present

## 2022-10-02 DIAGNOSIS — I10 Essential (primary) hypertension: Secondary | ICD-10-CM | POA: Diagnosis not present

## 2022-10-02 DIAGNOSIS — I739 Peripheral vascular disease, unspecified: Secondary | ICD-10-CM | POA: Diagnosis not present

## 2022-10-02 MED ORDER — TRIAMTERENE-HCTZ 37.5-25 MG PO TABS: 1.0000 | ORAL_TABLET | Freq: Every day | ORAL | 3 refills | Status: DC

## 2022-10-02 MED ORDER — AMLODIPINE BESYLATE 10 MG PO TABS: ORAL_TABLET | ORAL | 3 refills | Status: DC

## 2022-10-02 MED ORDER — ROSUVASTATIN CALCIUM 40 MG PO TABS: 40.0000 mg | ORAL_TABLET | Freq: Every day | ORAL | 3 refills | Status: AC

## 2022-10-02 NOTE — Patient Instructions (Signed)
Medication Instructions:  Your physician recommends that you continue on your current medications as directed. Please refer to the Current Medication list given to you today.  *If you need a refill on your cardiac medications before your next appointment, please call your pharmacy*   Lab Work: Your physician recommends that you return for lab work today- Lipid Panel, CBC, and CMP  If you have labs (blood work) drawn today and your tests are completely normal, you will receive your results only by: MyChart Message (if you have MyChart) OR A paper copy in the mail If you have any lab test that is abnormal or we need to change your treatment, we will call you to review the results.   Testing/Procedures: Your physician has requested that you have an echocardiogram. Echocardiography is a painless test that uses sound waves to create images of your heart. It provides your doctor with information about the size and shape of your heart and how well your heart's chambers and valves are working. This procedure takes approximately one hour. There are no restrictions for this procedure. Please do NOT wear cologne, perfume, aftershave, or lotions (deodorant is allowed). Please arrive 15 minutes prior to your appointment time.  Follow-Up: At [facility], you and your health needs are our priority.  As part of our continuing mission to provide you with exceptional heart care, we have created designated Provider Care Teams.  These Care Teams include your primary Cardiologist (physician) and Advanced Practice Providers (APPs -  Physician Assistants and Nurse Practitioners) who all work together to provide you with the care you need, when you need it.  We recommend signing up for the patient portal called "MyChart".  Sign up information is provided on this After Visit Summary.  MyChart is used to connect with patients for Virtual Visits (Telemedicine).  Patients are able to view lab/test results,  encounter notes, upcoming appointments, etc.  Non-urgent messages can be sent to your provider as well.   To learn more about what you can do with MyChart, go to [email or web address].    Your next appointment:   1 year(s)  Provider:   Chilton Si, MD or Gillian Shields, NP    Other Instructions We will send you a mychart message in two weeks to check on blood pressure

## 2022-10-02 NOTE — Progress Notes (Signed)
Office Visit    Patient Name: Nathan Ortega Date of Encounter: 10/02/2022  PCP:  Marden Noble, MD   [facility]  Cardiologist:  Chilton Si, MD  Advanced Practice Provider:  No care team member to display Electrophysiologist:  None      Chief Complaint    Nathan Ortega is a 57 y.o. male presents today for hypertension follow up    Past Medical History    Past Medical History:  Diagnosis Date   Aortic atherosclerosis (HCC) 05/26/2021   Diplopia 03/21/2021   HTN (hypertension)    Hypokalemia    PAD (peripheral artery disease) (HCC) 03/21/2021   Pure hypercholesterolemia 03/21/2021   Syncope and collapse 03/21/2021   Past Surgical History:  Procedure Laterality Date   Fistulotomy and marsupialization      Allergies  Allergies  Allergen Reactions   Lisinopril Cough   Olmesartan Cough    History of Present Illness    Nathan Ortega is a 57 y.o. male with a hx of hypertension, hyperlipidemia, hypokalemia, LVH (by echo 2014), aortic root dilation (4.1 cm at sinus of Valsalva, 4.5 cm ascending aorta by echo 2014), PAD last seen 05/2021 by Dr. Duke Salvia.  Prior episode global transient amnesia in 2009 he was initially placed on antihypertensives.  Prior CT abdomen/pelvis 11/2020 mild atherosclerosis in the iliac arteries.  Amlodipine previously increased and metoprolol discontinued.  Rosuvastatin initiated for hyperlipidemia.  He was seen at [facility] to consider renal denervation but it was not performed.  Last seen 05/26/2021 by Dr. Duke Salvia with BP at that time 118/80 on regimen on amlodipine, hydrochlorothiazide/triamterene.  Presents today for follow up independently.  Reports feeling well since last seen.  Exercising by walking his dog, hiking, [employer].  Endorses following a mostly heart healthy diet.  Enjoys boating with his family in the summer months.  Not monitoring BP routinely at home but does have upper arm cuff. Reports no shortness of  breath nor dyspnea on exertion. Reports no chest pain, pressure, or tightness. No edema, orthopnea, PND. Reports no palpitations.    EKGs/Labs/Other Studies Reviewed:   The following studies were reviewed today:     CTA Abdomen ([facility]) 11/09/2020: There are several small periesophageal and gastrohepatic ligament nodes  which are subcentimeter in size and are likely reactive.  Mild atherosclerotic changes noted. No significant stenosis of the celiac,  SMA or renal arteries noted. The IMA is patent. Atherosclerotic changes  also noted in the iliacs. There are bilateral small 2 to 4 mm  nonobstructing renal stones. Lung bases and bones are unremarkable   CT angiogram of the abdomen as described above without significant  narrowing or stenosis. Note is made of nonobstructing bilateral small  renal stones  EKG:  EKG is ordered today.  The ekg ordered today demonstrates NSR 74 bpm with no acute ST/T wave changes.   Recent Labs: No results found for requested labs within last 365 days.  Recent Lipid Panel    Component Value Date/Time   CHOL 239 (H) 03/21/2021 0939   TRIG 131 03/21/2021 0939   HDL 48 03/21/2021 0939   CHOLHDL 5.0 03/21/2021 0939   LDLCALC 167 (H) 03/21/2021 0939    Home Medications   Current Meds  Medication Sig   [DISCONTINUED] amLODipine (NORVASC) 10 MG tablet TAKE 1 TABLET DAILY (DISCONTINUE 5 MG PRESCRIPTION)   [DISCONTINUED] rosuvastatin (CRESTOR) 40 MG tablet Take 1 tablet (40 mg total) by mouth daily.   [DISCONTINUED]  triamterene-hydrochlorothiazide (MAXZIDE-25) 37.5-25 MG tablet Take 1 tablet by mouth daily.     Review of Systems      All other systems reviewed and are otherwise negative except as noted above.  Physical Exam    VS:  BP 132/88   Pulse 74   Ht 5\' 10"  (1.778 m)   Wt 176 lb (79.8 kg)   BMI 25.25 kg/m  , BMI Body mass index is 25.25 kg/m.  Wt Readings from Last 3 Encounters:  10/02/22 176 lb (79.8 kg)   05/26/21 169 lb 6.4 oz (76.8 kg)  04/26/21 169 lb (76.7 kg)     GEN: Well nourished, well developed, in no acute distress. HEENT: normal. Neck: Supple, no JVD, carotid bruits, or masses. Cardiac: RRR, no murmurs, rubs, or gallops. No clubbing, cyanosis, edema.  Radials/PT 2+ and equal bilaterally.  Respiratory:  Respirations regular and unlabored, clear to auscultation bilaterally. GI: Soft, nontender, nondistended. MS: No deformity or atrophy. Skin: Warm and dry, no rash. Neuro:  Strength and sensation are intact. Psych: Normal affect.  Assessment & Plan    Hypertension-initial BP 146/90 with repeat 132/88 without intervention.  He will monitor at home for 2 weeks and check in via MyChart.  If BP persistently elevated plan for additional antihypertensive therapy such as transition to amlodipine-olmesartan.  However, in the interim continue amlodipine 10 mg daily, and triamterene-HCTZ 37.5-25 mg daily.  CMP for monitoring of renal function.  CBC for monitoring as last collection >2 years ago.  Heart healthy diet and regular cardiovascular exercise encouraged.    Hyperlipidemia-LDL goal less than 70 due to aortic atherosclerosis, iliac artery atherosclerosis.  Continue rosuvastatin 40 mg daily.  Refill provided.  Update CMP, FLP today.  PAD/aortic atherosclerosis-atherosclerosis noted in iliac arteries on CT at [facility] 11/2020.  LDL goal less than 70.  Lipid management, as above.  No claudication symptoms.  Aortic dilation- Noted by echo 2014 (4.1 cm at sinus of Valsalva and 4.5 cm ascending aorta) update echocardiogram for monitoring.  Continue optimal BP control.  Recommend avoidance of fluoroquinolones.       Disposition: Follow up in 1 year(s) with Chilton Si, MD or APP.  Signed, Alver Sorrow, NP 10/02/2022, 8:38 AM [facility]

## 2022-10-03 LAB — COMPREHENSIVE METABOLIC PANEL
ALT: 26 IU/L (ref 0–44)
AST: 21 IU/L (ref 0–40)
Albumin/Globulin Ratio: 1.8 (ref 1.2–2.2)
Albumin: 4.8 g/dL (ref 3.8–4.9)
Alkaline Phosphatase: 79 IU/L (ref 44–121)
BUN/Creatinine Ratio: 11 (ref 9–20)
BUN: 13 mg/dL (ref 6–24)
Bilirubin Total: 0.6 mg/dL (ref 0.0–1.2)
CO2: 27 mmol/L (ref 20–29)
Calcium: 9.4 mg/dL (ref 8.7–10.2)
Chloride: 100 mmol/L (ref 96–106)
Creatinine, Ser: 1.2 mg/dL (ref 0.76–1.27)
Globulin, Total: 2.6 g/dL (ref 1.5–4.5)
Glucose: 101 mg/dL — ABNORMAL HIGH (ref 70–99)
Potassium: 4.8 mmol/L (ref 3.5–5.2)
Sodium: 143 mmol/L (ref 134–144)
Total Protein: 7.4 g/dL (ref 6.0–8.5)
eGFR: 72 mL/min/{1.73_m2} (ref >59)

## 2022-10-03 LAB — LIPID PANEL
Chol/HDL Ratio: 3.6 ratio (ref 0.0–5.0)
Cholesterol, Total: 182 mg/dL (ref 100–199)
HDL: 50 mg/dL (ref >39)
LDL Chol Calc (NIH): 108 mg/dL — ABNORMAL HIGH (ref 0–99)
Triglycerides: 137 mg/dL (ref 0–149)
VLDL Cholesterol Cal: 24 mg/dL (ref 5–40)

## 2022-10-03 LAB — CBC
Hematocrit: 50.4 % (ref 37.5–51.0)
Hemoglobin: 17 g/dL (ref 13.0–17.7)
MCH: 29.1 pg (ref 26.6–33.0)
MCHC: 33.7 g/dL (ref 31.5–35.7)
MCV: 86 fL (ref 79–97)
Platelets: 244 10*3/uL (ref 150–450)
RBC: 5.84 x10E6/uL — ABNORMAL HIGH (ref 4.14–5.80)
RDW: 12.5 % (ref 11.6–15.4)
WBC: 6.2 10*3/uL (ref 3.4–10.8)

## 2022-10-04 ENCOUNTER — Telehealth (HOSPITAL_BASED_OUTPATIENT_CLINIC_OR_DEPARTMENT_OTHER): Payer: Self-pay

## 2022-10-04 DIAGNOSIS — E785 Hyperlipidemia, unspecified: Secondary | ICD-10-CM

## 2022-10-04 MED ORDER — EZETIMIBE 10 MG PO TABS: 10.0000 mg | ORAL_TABLET | Freq: Every day | ORAL | 3 refills | Status: DC

## 2022-10-04 NOTE — Telephone Encounter (Addendum)
Seen by patient Nathan Ortega on 10/03/2022  3:08 PM; follow up mychart message sent to patient    ----- Message from Alver Sorrow, NP sent at 10/03/2022  1:41 PM EDT ----- Normal kidneys, liver, electrolytes.  LDL (bad cholesterol) of 108 which is improved from previous (LDL one year ago was 167) but not at goal of <70.   Ensure taking Rosuvastatin 40mg  daily. Recommend aiming for 150 minutes of moderate intensity activity per week and following a heart healthy diet.   Add Zetia 10mg  daily to help get cholesterol to goal with FLP/LFT in 3 months.

## 2022-10-04 NOTE — Addendum Note (Signed)
Addended by: Marlene Lard on: 10/04/2022 10:04 AM   Modules accepted: Orders

## 2022-10-09 ENCOUNTER — Other Ambulatory Visit (HOSPITAL_BASED_OUTPATIENT_CLINIC_OR_DEPARTMENT_OTHER): Payer: Self-pay

## 2022-10-09 ENCOUNTER — Ambulatory Visit (INDEPENDENT_AMBULATORY_CARE_PROVIDER_SITE_OTHER): Payer: 59

## 2022-10-09 DIAGNOSIS — I77810 Thoracic aortic ectasia: Secondary | ICD-10-CM | POA: Diagnosis not present

## 2022-10-09 LAB — ECHOCARDIOGRAM COMPLETE
AR max vel: 3.25 cm2
AV Area VTI: 3.33 cm2
AV Area mean vel: 3.32 cm2
AV Mean grad: 4 mmHg
AV Peak grad: 7.3 mmHg
AV Vena cont: 0.25 cm
Ao pk vel: 1.35 m/s
Area-P 1/2: 4.21 cm2
P 1/2 time: 542 msec
S' Lateral: 2.97 cm

## 2022-10-16 ENCOUNTER — Encounter (HOSPITAL_BASED_OUTPATIENT_CLINIC_OR_DEPARTMENT_OTHER): Payer: Self-pay

## 2022-12-13 ENCOUNTER — Ambulatory Visit (INDEPENDENT_AMBULATORY_CARE_PROVIDER_SITE_OTHER): Payer: 59 | Admitting: Mental Health

## 2022-12-13 DIAGNOSIS — F4323 Adjustment disorder with mixed anxiety and depressed mood: Secondary | ICD-10-CM | POA: Diagnosis not present

## 2022-12-13 NOTE — Progress Notes (Signed)
[facility] Psychotherapy Note     Name: Nathan Ortega Date: 12/13/22 MRN: [id number] DOB: [birth year] PCP: Marden Noble, MD   Time spent: 50 minutes   Treatment: individual therapy   Mental Status Exam:    Appearance:    Casual     Behavior:   Appropriate  Motor:   Normal  Speech/Language:    Clear/coherant  Affect:   Full range  Mood:   Anxious, pleasant  Thought process:   normal  Thought content:     WNL  Sensory/Perceptual disturbances:     WNL  Orientation:   x4  Attention:   Good  Concentration:   Good  Memory:   WNL  Fund of knowledge:    Good  Insight:     Good  Judgment:    Good  Impulse Control:   Good    Reported Symptoms:  Some anxiety and sad feelings   Risk Assessment: Danger to Self:  No Self-injurious Behavior: No Danger to Others: No Duty to Warn:no Physical Aggression / Violence:No  Access to Firearms a concern: No  Gang Involvement:No  Patient / guardian was educated about steps to take if suicide or homicide risk level increases between visits: yes While future psychiatric events cannot be accurately predicted, the patient does not currently require acute inpatient psychiatric care and does not currently meet [facility] involuntary commitment criteria. Medications:       Current Outpatient Medications  Medication Sig Dispense Refill   amLODipine (NORVASC) 10 MG tablet Take 15 mg by mouth daily.       triamterene-hydrochlorothiazide (MAXZIDE-25) 37.5-25 MG tablet Take 1 tablet by mouth daily.          No current facility-administered medications for this visit.        Subjective: Patient presents for session on time.  Assessed needs as this is patient's return to therapy after approximately 2 years.  He shared challenges with his son who copes with severe mental health issues, how he is required recent hospitalization psychiatrically.  He shared details, how they are trying to be supportive of his began to experience more mental  health challenges, symptoms related to mood instability and psychosis.  He reported that he came to live with them again after quitting his job a few months ago and how he has exhibited several bizarre and at times, safety concerning behaviors.  He stated that his son was impatient 2 times, most recently last week.  Patient shared thoughts, feelings of how this has been difficult and stressful for the family, specifically himself and his wife.  He identified the need to work on his reactions, stating that his wife has told him he has been irritable and "snappy".  Patient acknowledges this is true as this was also corroborated by his 2 adult sons.  Gave patient homework assignment to identify specific instances between visits when this occurs.  He did identify today that he does not like to have questions asked of him, particularly when they are probing it difficult to answer.    Interventions: Solution focused approaches, supportive therapy   Diagnoses:      ICD-10-CM    1. Adjustment disorder with anxious mood  F43.22        Plan: Patient is to use CBT, mindfulness and coping skills to  Decrease anxiety.    Patient to continue to maintain abstinence from viewing online material with which he and for his wife might consider inappropriate.   Long-term goal:   Reduce  overall level, frequency, and intensity of the feelings of anxiety.  Patient to rebuild trust with his wife in their marital relationship.   Short-term goal:  Maintain honesty in his marital relationship to build trust again. Improve communication in his marital relationship evidenced by sharing thoughts and feelings as opposed to suppressing them Increase insight toward further understanding thoughts and behaviors which led to a decrease in trust in his marital relationship     Assessment of progress:  progressing     Waldron Session, [facility]

## 2023-01-02 ENCOUNTER — Ambulatory Visit (INDEPENDENT_AMBULATORY_CARE_PROVIDER_SITE_OTHER): Payer: 59 | Admitting: Mental Health

## 2023-01-02 DIAGNOSIS — F4323 Adjustment disorder with mixed anxiety and depressed mood: Secondary | ICD-10-CM

## 2023-01-02 NOTE — Progress Notes (Signed)
[facility] Psychotherapy Note     Name: Nathan Ortega Date: 01/02/23 MRN: [id number] DOB: [birth year] PCP: Marden Noble, MD   Time spent: 52 minutes Time in: 10: 00 a.m. time out: 10: 52am   Treatment: individual therapy   Mental Status Exam:    Appearance:    Casual     Behavior:   Appropriate  Motor:   Normal  Speech/Language:    Clear/coherant  Affect:   Full range  Mood:   Euthymic  Thought process:   normal  Thought content:     WNL  Sensory/Perceptual disturbances:     WNL  Orientation:   x4  Attention:   Good  Concentration:   Good  Memory:   WNL  Fund of knowledge:    Good  Insight:     Good  Judgment:    Good  Impulse Control:   Good    Reported Symptoms:  Some anxiety and sad feelings   Risk Assessment: Danger to Self:  No Self-injurious Behavior: No Danger to Others: No Duty to Warn:no Physical Aggression / Violence:No  Access to Firearms a concern: No  Gang Involvement:No  Patient / guardian was educated about steps to take if suicide or homicide risk level increases between visits: yes While future psychiatric events cannot be accurately predicted, the patient does not currently require acute inpatient psychiatric care and does not currently meet [facility] involuntary commitment criteria. Medications:       Current Outpatient Medications  Medication Sig Dispense Refill   amLODipine (NORVASC) 10 MG tablet Take 15 mg by mouth daily.       triamterene-hydrochlorothiazide (MAXZIDE-25) 37.5-25 MG tablet Take 1 tablet by mouth daily.          No current facility-administered medications for this visit.        Subjective: Patient presents for session on time.  Patient shared recent events, his son currently living with he and his wife again after being discharged from inpatient a few days ago.  He shared how they visit him while he was inpatient, that his stay was approximately 2 weeks.  He stated that it took several days for his son's  symptoms to get managed by medication in the hospital.  He stated that they establish some rules while he is at home with them such as not having access to his car and other limitations centered around trying to ensure his safety.  Reviewed his progress with identifying instances where he may get irritable or frustrated and responses.  He shared an example of when he was upset with his son's response when inpatient during a meeting with his treatment team.  Patient stated that he was open to processing the issue with his wife and how he could try to utilize reflective or active listening as opposed to being as confrontative.  Facilitated his identifying emotions at that time, how he plans to try to be mindful of potential situations and how he wants to respond effectively with less noticeable anger.   Interventions: Solution focused approaches, supportive therapy   Diagnoses:      ICD-10-CM    1. Adjustment disorder with anxious mood  F43.22        Plan: Patient is to use CBT, mindfulness and coping skills to  Decrease anxiety.  Continue to monitor and increase awareness of his emotions, particularly when feeling frustrated or irritable and responses.  Long-term goal:   Reduce overall level, frequency, and intensity of the feelings of anxiety for  at least 3 consecutive months per patient report.   Short-term goal:  Improve communication in his marital relationship evidenced by sharing thoughts and feelings as opposed to suppressing them Improved identification of issues and/or experiences that lead patient to become frustrated and irritable and work to improve coping with his responses     Assessment of progress:  progressing     Waldron Session, [facility]

## 2023-01-07 ENCOUNTER — Ambulatory Visit: Payer: 59 | Admitting: Mental Health

## 2023-01-17 ENCOUNTER — Ambulatory Visit (INDEPENDENT_AMBULATORY_CARE_PROVIDER_SITE_OTHER): Payer: 59 | Admitting: Mental Health

## 2023-01-17 DIAGNOSIS — F4323 Adjustment disorder with mixed anxiety and depressed mood: Secondary | ICD-10-CM | POA: Diagnosis not present

## 2023-01-17 NOTE — Progress Notes (Signed)
[facility] Psychotherapy Note     Name: Nathan Ortega Date: 01/17/23 MRN: [id number] DOB: [birth year] PCP: Marden Noble, MD   Time spent: 51 minutes Time in: 11: 00 a.m. time out: 11:51am   Treatment: individual therapy   Mental Status Exam:    Appearance:    Casual     Behavior:   Appropriate  Motor:   Normal  Speech/Language:    Clear/coherant  Affect:   Full range  Mood:   Euthymic  Thought process:   normal  Thought content:     WNL  Sensory/Perceptual disturbances:     WNL  Orientation:   x4  Attention:   Good  Concentration:   Good  Memory:   WNL  Fund of knowledge:    Good  Insight:     Good  Judgment:    Good  Impulse Control:   Good    Reported Symptoms:  Some anxiety and sad feelings   Risk Assessment: Danger to Self:  No Self-injurious Behavior: No Danger to Others: No Duty to Warn:no Physical Aggression / Violence:No  Access to Firearms a concern: No  Gang Involvement:No  Patient / guardian was educated about steps to take if suicide or homicide risk level increases between visits: yes While future psychiatric events cannot be accurately predicted, the patient does not currently require acute inpatient psychiatric care and does not currently meet [facility] involuntary commitment criteria. Medications:       Current Outpatient Medications  Medication Sig Dispense Refill   amLODipine (NORVASC) 10 MG tablet Take 15 mg by mouth daily.       triamterene-hydrochlorothiazide (MAXZIDE-25) 37.5-25 MG tablet Take 1 tablet by mouth daily.          No current facility-administered medications for this visit.        Subjective: Patient presents for session on time.  He shared recent events, how his son remains at home as he and his wife are providing support after his mental health crisis resulting in inpatient recently.  He stated that his son has been pleasant and not exhibiting the symptoms associated with his needing inpatient.  He stated that he  also followed through with his psychiatric appointment where this was reassuring for patient and his wife.  He stated he and his wife continue to give their son space and time to adjust however, he stated that at some point they plan to try to take a more active role in his care to avoid his having another severe episode.  He shared how he continues to try to be mindful of his reactions particularly with his wife, reporting their relationship is going well overall however, identifying how he tries to be mindful of the way he communicates as to not appear irritable or agitated.  He he shared more details related their communication, his need at times to stop having discussions when he starts to become upset and how his wife wants to continue.  Ways to disengage while also informing his wife of this need was explored.   Interventions: Solution focused approaches, supportive therapy   Diagnoses:      ICD-10-CM    1. Adjustment disorder with anxious mood  F43.22        Plan: Patient is to use CBT, mindfulness and coping skills to  Decrease anxiety.  Continue to monitor and increase awareness of his emotions, particularly when feeling frustrated or irritable and responses.  Long-term goal:   Reduce overall level, frequency, and intensity of  the feelings of anxiety for at least 3 consecutive months per patient report.   Short-term goal:  Improve communication in his marital relationship evidenced by sharing thoughts and feelings as opposed to suppressing them Improved identification of issues and/or experiences that lead patient to become frustrated and irritable and work to improve coping with his responses     Assessment of progress:  progressing     Waldron Session, [facility]

## 2023-01-31 ENCOUNTER — Ambulatory Visit (INDEPENDENT_AMBULATORY_CARE_PROVIDER_SITE_OTHER): Payer: 59 | Admitting: Mental Health

## 2023-01-31 DIAGNOSIS — F4323 Adjustment disorder with mixed anxiety and depressed mood: Secondary | ICD-10-CM | POA: Diagnosis not present

## 2023-01-31 NOTE — Progress Notes (Signed)
[facility] Psychotherapy Note     Name: Nathan Ortega Date: 01/31/23 MRN: [id number] DOB: March 01, 1966 PCP: Marden Noble, MD   Time spent: 50 minutes Time in: 11: 00 a.m. time out: 11:50 am   Treatment: individual therapy   Mental Status Exam:    Appearance:    Casual     Behavior:   Appropriate  Motor:   Normal  Speech/Language:    Clear/coherant  Affect:   Full range  Mood:   Euthymic  Thought process:   normal  Thought content:     WNL  Sensory/Perceptual disturbances:     WNL  Orientation:   x4  Attention:   Good  Concentration:   Good  Memory:   WNL  Fund of knowledge:    Good  Insight:     Good  Judgment:    Good  Impulse Control:   Good    Reported Symptoms:  Some anxiety and sad feelings   Risk Assessment: Danger to Self:  No Self-injurious Behavior: No Danger to Others: No Duty to Warn:no Physical Aggression / Violence:No  Access to Firearms a concern: No  Gang Involvement:No  Patient / guardian was educated about steps to take if suicide or homicide risk level increases between visits: yes While future psychiatric events cannot be accurately predicted, the patient does not currently require acute inpatient psychiatric care and does not currently meet [facility] involuntary commitment criteria. Medications:       Current Outpatient Medications  Medication Sig Dispense Refill   amLODipine (NORVASC) 10 MG tablet Take 15 mg by mouth daily.       triamterene-hydrochlorothiazide (MAXZIDE-25) 37.5-25 MG tablet Take 1 tablet by mouth daily.          No current facility-administered medications for this visit.        Subjective: Patient presents for session on time.  Patient shared ongoing support both he and his wife provide to their adult son who continues to live with them after his inpatient stay.  He stated that his son appears to be making progress, attends his psychiatric appointments and has been more pleasurable to be around and appears  stable.  Explored with patient progress regarding his communication, agitation and irritability.  He shared his efforts to continue to try and be mindful of the way he communicates as to not appear irritable or agitated.  He shared a recent incident where his wife questioned his being mildly frustrated due to his body language, where he acknowledged he needs to continue to improve his awareness of this type of communication as well only what he communicates through his words.  Through further guided discovery, he identified the benefit of his being more assertive with his communication as opposed to suppressing thoughts and feelings.  Ways to do so was explored collaboratively.  Interventions: Solution focused approaches, supportive therapy   Diagnoses:      ICD-10-CM    1. Adjustment disorder with anxious mood  F43.22        Plan: Patient is to use CBT, mindfulness and coping skills to  Decrease anxiety.  Continue to monitor and increase awareness of his emotions, particularly when feeling frustrated or irritable and responses.  Long-term goal:   Reduce overall level, frequency, and intensity of the feelings of anxiety for at least 3 consecutive months per patient report.   Short-term goal:  Improve communication in his marital relationship evidenced by sharing thoughts and feelings as opposed to suppressing them Improved identification of issues  and/or experiences that lead patient to become frustrated and irritable and work to improve coping with his responses     Assessment of progress:  progressing     Waldron Session, [facility]

## 2023-02-21 ENCOUNTER — Ambulatory Visit (INDEPENDENT_AMBULATORY_CARE_PROVIDER_SITE_OTHER): Payer: 59 | Admitting: Mental Health

## 2023-02-21 DIAGNOSIS — F4323 Adjustment disorder with mixed anxiety and depressed mood: Secondary | ICD-10-CM

## 2023-02-21 NOTE — Progress Notes (Signed)
[facility] Psychotherapy Note     Name: Franciszek Platten Date: 02/21/23 MRN: [id number] DOB: Aug 19, 1965 PCP: Marden Noble, MD   Time spent: 50 minutes Time in: 11: 00 a.m. time out: 11:49 am   Treatment: individual therapy   Mental Status Exam:    Appearance:    Casual     Behavior:   Appropriate  Motor:   Normal  Speech/Language:    Clear/coherant  Affect:   Full range  Mood:   Euthymic  Thought process:   normal  Thought content:     WNL  Sensory/Perceptual disturbances:     WNL  Orientation:   x4  Attention:   Good  Concentration:   Good  Memory:   WNL  Fund of knowledge:    Good  Insight:     Good  Judgment:    Good  Impulse Control:   Good    Reported Symptoms:  Some anxiety and sad feelings   Risk Assessment: Danger to Self:  No Self-injurious Behavior: No Danger to Others: No Duty to Warn:no Physical Aggression / Violence:No  Access to Firearms a concern: No  Gang Involvement:No  Patient / guardian was educated about steps to take if suicide or homicide risk level increases between visits: yes While future psychiatric events cannot be accurately predicted, the patient does not currently require acute inpatient psychiatric care and does not currently meet [facility] involuntary commitment criteria. Medications:       Current Outpatient Medications  Medication Sig Dispense Refill   amLODipine (NORVASC) 10 MG tablet Take 15 mg by mouth daily.       triamterene-hydrochlorothiazide (MAXZIDE-25) 37.5-25 MG tablet Take 1 tablet by mouth daily.          No current facility-administered medications for this visit.        Subjective: Patient presents for session on time.  Patient shared how his uncle who continues to reside with him and his wife, continues to make positive progress as.  He stated that he has secured a part-time job list to secure a full-time job next month or so after his pending court date.  Patient stated that he is hopeful that the  charges will be dismissed as his son engaged in behavior while in a crisis mental health stated, soon thereafter going inpatient psychiatrically.  He stated that he has reached out to the owner of the company and has had conducted a meaningful discussion complaining of feeling and how his son is altering scale and had a discussion as well.  Assessed his progress with managing his mood, irritability where he stated that he is had no recent incidents.  Facilitated his identifying ways he has been more successful where he shared an example of how he did not confirm his wife when she was speaking loudly on the phone call as he was trying to go to sleep.  Interventions: Solution focused approaches, supportive therapy   Diagnoses:      ICD-10-CM    1. Adjustment disorder with anxious mood  F43.22        Plan: Patient is to use CBT, mindfulness and coping skills to  Decrease anxiety.  Continue to monitor and increase awareness of his emotions, particularly when feeling frustrated or irritable and responses.  Long-term goal:   Reduce overall level, frequency, and intensity of the feelings of anxiety for at least 3 consecutive months per patient report.   Short-term goal:  Improve communication in his marital relationship evidenced by sharing thoughts  and feelings as opposed to suppressing them Improved identification of issues and/or experiences that lead patient to become frustrated and irritable and work to improve coping with his responses     Assessment of progress:  progressing     Waldron Session, [facility]

## 2023-03-08 ENCOUNTER — Other Ambulatory Visit: Payer: Self-pay | Admitting: Cardiovascular Disease

## 2023-03-08 DIAGNOSIS — I10 Essential (primary) hypertension: Secondary | ICD-10-CM

## 2023-03-14 ENCOUNTER — Ambulatory Visit (INDEPENDENT_AMBULATORY_CARE_PROVIDER_SITE_OTHER): Payer: 59 | Admitting: Mental Health

## 2023-03-14 DIAGNOSIS — F4323 Adjustment disorder with mixed anxiety and depressed mood: Secondary | ICD-10-CM | POA: Diagnosis not present

## 2023-03-14 NOTE — Progress Notes (Signed)
[facility] Psychotherapy Note     Name: Nathan Ortega Date: 03/14/23 MRN: [id number] DOB: 11-07-1965 PCP: Marden Noble, MD   Time spent: 50 minutes Time in: 11: 00 a.m. time out: 11:50 am   Treatment: individual therapy   Mental Status Exam:    Appearance:    Casual     Behavior:   Appropriate  Motor:   Normal  Speech/Language:    Clear/coherant  Affect:   Full range  Mood:   Euthymic  Thought process:   normal  Thought content:     WNL  Sensory/Perceptual disturbances:     WNL  Orientation:   x4  Attention:   Good  Concentration:   Good  Memory:   WNL  Fund of knowledge:    Good  Insight:     Good  Judgment:    Good  Impulse Control:   Good    Reported Symptoms:  Some anxiety and sad feelings   Risk Assessment: Danger to Self:  No Self-injurious Behavior: No Danger to Others: No Duty to Warn:no Physical Aggression / Violence:No  Access to Firearms a concern: No  Gang Involvement:No  Patient / guardian was educated about steps to take if suicide or homicide risk level increases between visits: yes While future psychiatric events cannot be accurately predicted, the patient does not currently require acute inpatient psychiatric care and does not currently meet [facility] involuntary commitment criteria. Medications:       Current Outpatient Medications  Medication Sig Dispense Refill   amLODipine (NORVASC) 10 MG tablet Take 15 mg by mouth daily.       triamterene-hydrochlorothiazide (MAXZIDE-25) 37.5-25 MG tablet Take 1 tablet by mouth daily.          No current facility-administered medications for this visit.        Subjective: Patient presents for session on time.  Patient arrived on time for today's session. Assessed progress. Patient shared recent challenges where he had difficulty managing his anger. He said that it was when attempting to fix a dryer. He said that he's not highly mechanically inclined but made a few attempts until relenting. He  stated that by his behavior and voice frustration his wife noticed and attempted to assist him. He stated that he can often feel self-critical and self-judgmental due to his having difficulties over the years with such tasks. Identified "I feel stupid" as an example of one of his negative thoughts. Provided support and time for patient to share experiences and feelings, facilitated his working to reframe. Identified also wanting to be less upset while in traffic and some instances, he denies it being a significant problem, just that he voices frustration with other drivers negatively and this has been brought to his attention by his wife.   Interventions: Solution focused approaches, supportive therapy   Diagnoses:      ICD-10-CM    1. Adjustment disorder with anxious mood  F43.22        Plan: Patient is to use CBT, mindfulness and coping skills to  Decrease anxiety.  Continue to monitor and increase awareness of his emotions, particularly when feeling frustrated or irritable and responses.  Long-term goal:   Reduce overall level, frequency, and intensity of the feelings of anxiety for at least 3 consecutive months per patient report.   Short-term goal:  Improve communication in his marital relationship evidenced by sharing thoughts and feelings as opposed to suppressing them Improved identification of issues and/or experiences that lead patient to become  frustrated and irritable and work to improve coping with his responses     Assessment of progress:  progressing     Waldron Session, [facility]

## 2023-04-02 ENCOUNTER — Ambulatory Visit: Payer: 59 | Admitting: Mental Health

## 2023-04-02 DIAGNOSIS — F4323 Adjustment disorder with mixed anxiety and depressed mood: Secondary | ICD-10-CM | POA: Diagnosis not present

## 2023-04-02 NOTE — Progress Notes (Signed)
[facility] Psychotherapy Note     Name: Nathan Ortega Date: 04/02/23 MRN: [id number] DOB: May 30, 1965 PCP: Marden Noble, MD   Time spent: 48 minutes Time in: 11: 00 a.m. time out: 11:48 am   Treatment: individual therapy   Mental Status Exam:    Appearance:    Casual     Behavior:   Appropriate  Motor:   Normal  Speech/Language:    Clear/coherant  Affect:   Full range  Mood:   Euthymic  Thought process:   normal  Thought content:     WNL  Sensory/Perceptual disturbances:     WNL  Orientation:   x4  Attention:   Good  Concentration:   Good  Memory:   WNL  Fund of knowledge:    Good  Insight:     Good  Judgment:    Good  Impulse Control:   Good    Reported Symptoms:  Some anxiety and sad feelings, irritability   Risk Assessment: Danger to Self:  No Self-injurious Behavior: No Danger to Others: No Duty to Warn:no Physical Aggression / Violence:No  Access to Firearms a concern: No  Gang Involvement:No  Patient / guardian was educated about steps to take if suicide or homicide risk level increases between visits: yes While future psychiatric events cannot be accurately predicted, the patient does not currently require acute inpatient psychiatric care and does not currently meet [facility] involuntary commitment criteria. Medications:       Current Outpatient Medications  Medication Sig Dispense Refill   amLODipine (NORVASC) 10 MG tablet Take 15 mg by mouth daily.       triamterene-hydrochlorothiazide (MAXZIDE-25) 37.5-25 MG tablet Take 1 tablet by mouth daily.          No current facility-administered medications for this visit.        Subjective: Patient presents for session.  Assessed progress where he stated that he and his wife had a pleasant short vacation out of town attending a concert.  He went on to share how he has been able to manage his mood effectively, denying having any irritability or anger issues.  Explored stressors where he stated that  relationships are going well in the family, his son who continues to reside with both he and his wife, has been more open in talking with him about various issues which he stated is progress.  He stated that he continues to have questions, along with his wife about his son's mental health due to his having a psychiatric inpatient stay about 2 months ago; this was his fourth inpatient stay psychiatrically over the past few years.  He identified how he and his wife want to eventually to know more about his mental health needs, considering talking to him soon to learn more about how to support him while also being mindful of trying to frame it in a supportive way as he stated his son can get defensive at times.  We discussed further how this need identified Tyzine to his working on not suppressing his own feelings, thoughts.   Interventions: Solution focused approaches, supportive therapy   Diagnoses:      ICD-10-CM    1. Adjustment disorder with anxious mood  F43.22        Plan: Patient is to use CBT, mindfulness and coping skills to  Decrease anxiety.  Continue to monitor and increase awareness of his emotions, particularly when feeling frustrated or irritable and responses.  Long-term goal:   Reduce overall level, frequency, and  intensity of the feelings of anxiety for at least 3 consecutive months per patient report.   Short-term goal:  Improve communication in his marital relationship evidenced by sharing thoughts and feelings as opposed to suppressing them Improved identification of issues and/or experiences that lead patient to become frustrated and irritable and work to improve coping with his responses     Assessment of progress:  progressing     Waldron Session, [facility]

## 2023-04-22 ENCOUNTER — Ambulatory Visit: Payer: 59 | Admitting: Mental Health

## 2023-04-22 NOTE — Progress Notes (Unsigned)
[facility] Psychotherapy Note     Name: Nathan Ortega Date: 04/22/23 MRN: [id number] DOB: 08-Nov-1965 PCP: Marden Noble, MD   Time spent: 47 minutes Time in: 11: 00 a.m. time out: 11:47 am   Treatment: individual therapy   Mental Status Exam:    Appearance:    Casual     Behavior:   Appropriate  Motor:   Normal  Speech/Language:    Clear/coherant  Affect:   Full range  Mood:   Euthymic  Thought process:   normal  Thought content:     WNL  Sensory/Perceptual disturbances:     WNL  Orientation:   x4  Attention:   Good  Concentration:   Good  Memory:   WNL  Fund of knowledge:    Good  Insight:     Good  Judgment:    Good  Impulse Control:   Good    Reported Symptoms:  Some anxiety and sad feelings, irritability   Risk Assessment: Danger to Self:  No Self-injurious Behavior: No Danger to Others: No Duty to Warn:no Physical Aggression / Violence:No  Access to Firearms a concern: No  Gang Involvement:No  Patient / guardian was educated about steps to take if suicide or homicide risk level increases between visits: yes While future psychiatric events cannot be accurately predicted, the patient does not currently require acute inpatient psychiatric care and does not currently meet [facility] involuntary commitment criteria. Medications:       Current Outpatient Medications  Medication Sig Dispense Refill   amLODipine (NORVASC) 10 MG tablet Take 15 mg by mouth daily.       triamterene-hydrochlorothiazide (MAXZIDE-25) 37.5-25 MG tablet Take 1 tablet by mouth daily.          No current facility-administered medications for this visit.        Subjective: Patient presents for session.    Assessed progress where he stated that he and his wife had a pleasant short vacation out of town attending a concert.  He went on to share how he has been able to manage his mood effectively, denying having any irritability or anger issues.  Explored stressors where he stated  that relationships are going well in the family, his son who continues to reside with both he and his wife, has been more open in talking with him about various issues which he stated is progress.  He stated that he continues to have questions, along with his wife about his son's mental health due to his having a psychiatric inpatient stay about 2 months ago; this was his fourth inpatient stay psychiatrically over the past few years.  He identified how he and his wife want to eventually to know more about his mental health needs, considering talking to him soon to learn more about how to support him while also being mindful of trying to frame it in a supportive way as he stated his son can get defensive at times.  We discussed further how this need identified Tyzine to his working on not suppressing his own feelings, thoughts.   Interventions: Solution focused approaches, supportive therapy   Diagnoses:      ICD-10-CM    1. Adjustment disorder with anxious mood  F43.22        Plan: Patient is to use CBT, mindfulness and coping skills to  Decrease anxiety.  Continue to monitor and increase awareness of his emotions, particularly when feeling frustrated or irritable and responses.  Long-term goal:   Reduce overall level,  frequency, and intensity of the feelings of anxiety for at least 3 consecutive months per patient report.   Short-term goal:  Improve communication in his marital relationship evidenced by sharing thoughts and feelings as opposed to suppressing them Improved identification of issues and/or experiences that lead patient to become frustrated and irritable and work to improve coping with his responses     Assessment of progress:  progressing     Waldron Session, [facility]

## 2023-05-22 ENCOUNTER — Ambulatory Visit (INDEPENDENT_AMBULATORY_CARE_PROVIDER_SITE_OTHER): Payer: 59 | Admitting: Mental Health

## 2023-05-22 DIAGNOSIS — F4323 Adjustment disorder with mixed anxiety and depressed mood: Secondary | ICD-10-CM

## 2023-05-22 NOTE — Progress Notes (Signed)
 [facility] Psychotherapy Note     Name: Nathan Ortega Date: 05/22/23 MRN: [id number] DOB: 09/01/1965 PCP: Delice Charleston, MD   Time spent: 46 minutes Time in: 11: 00 a.m. time out: 11:46 am   Treatment: individual therapy   Mental Status Exam:    Appearance:    Casual     Behavior:   Appropriate  Motor:   Normal  Speech/Language:    Clear/coherant  Affect:   Full range  Mood:   Euthymic  Thought process:   normal  Thought content:     WNL  Sensory/Perceptual disturbances:     WNL  Orientation:   x4  Attention:   Good  Concentration:   Good  Memory:   WNL  Fund of knowledge:    Good  Insight:     Good  Judgment:    Good  Impulse Control:   Good    Reported Symptoms:  Some anxiety and sad feelings, irritability   Risk Assessment: Danger to Self:  No Self-injurious Behavior: No Danger to Others: No Duty to Warn:no Physical Aggression / Violence:No  Access to Firearms a concern: No  Gang Involvement:No  Patient / guardian was educated about steps to take if suicide or homicide risk level increases between visits: yes While future psychiatric events cannot be accurately predicted, the patient does not currently require acute inpatient psychiatric care and does not currently meet [facility]  involuntary commitment criteria. Medications:       Current Outpatient Medications  Medication Sig Dispense Refill   amLODipine  (NORVASC ) 10 MG tablet Take 15 mg by mouth daily.       triamterene -hydrochlorothiazide (MAXZIDE-25) 37.5-25 MG tablet Take 1 tablet by mouth daily.          No current facility-administered medications for this visit.        Subjective: Patient presents for session.  Assessed progress where patient shared how he and family had a pleasant Christmas holiday.  He stated that all of his sons were at home and they were there to spend time for a few days.  He stated they also went on a vacation as a family last week.  He stated that his son who had  needed psychiatric inpatient a few months ago has continued to stabilize.  They continue to want to have further discussions with him as a preventative measure to further prepare if he has another psychotic break.  At this point, he has had 4 episodes over the past few years.  He reports he continues to see his psychiatrist but is currently not on medication.  Assess patient's mood where he stated he has felt good, denies having any noticeable moments of irritability going on a workout he is been under less stress overall and just grateful for his sons continued recovery.  Ways to continuing to cope and care for himself and continue to work on his communication in his relationships was explored collaboratively.  Interventions: Solution focused approaches, supportive therapy   Diagnoses:      ICD-10-CM    1. Adjustment disorder with anxious mood  F43.22        Plan: Patient is to use CBT, mindfulness and coping skills to  Decrease anxiety.  Continue to monitor and increase awareness of his emotions, particularly when feeling frustrated or irritable and responses.  Long-term goal:   Reduce overall level, frequency, and intensity of the feelings of anxiety for at least 3 consecutive months per patient report.   Short-term goal:  Improve  communication in his marital relationship evidenced by sharing thoughts and feelings as opposed to suppressing them Improved identification of issues and/or experiences that lead patient to become frustrated and irritable and work to improve coping with his responses     Assessment of progress:  progressing     Lonni Fischer, [facility]

## 2023-10-14 ENCOUNTER — Encounter (HOSPITAL_BASED_OUTPATIENT_CLINIC_OR_DEPARTMENT_OTHER): Payer: Self-pay

## 2023-10-21 ENCOUNTER — Ambulatory Visit (INDEPENDENT_AMBULATORY_CARE_PROVIDER_SITE_OTHER)

## 2023-10-21 ENCOUNTER — Ambulatory Visit (HOSPITAL_BASED_OUTPATIENT_CLINIC_OR_DEPARTMENT_OTHER): Payer: Self-pay | Admitting: Family

## 2023-10-21 DIAGNOSIS — I77810 Thoracic aortic ectasia: Secondary | ICD-10-CM | POA: Diagnosis not present

## 2023-10-21 LAB — ECHOCARDIOGRAM COMPLETE
AV Vena cont: 0.22 cm
Area-P 1/2: 3.65 cm2
P 1/2 time: 447 ms
S' Lateral: 2.4 cm

## 2023-12-03 ENCOUNTER — Other Ambulatory Visit (HOSPITAL_BASED_OUTPATIENT_CLINIC_OR_DEPARTMENT_OTHER): Payer: Self-pay | Admitting: Family

## 2023-12-03 DIAGNOSIS — I10 Essential (primary) hypertension: Secondary | ICD-10-CM

## 2023-12-22 NOTE — Progress Notes (Unsigned)
 Cardiology Office Note   Date:  12/23/2023  ID:  Nathan Ortega, DOB Jun 08, 1965, MRN [id number] PCP: Delice Charleston, MD (Inactive)  [facility] Providers Cardiologist:  Annabella Scarce, MD     PMH Hypertension Hyperlipidemia Hypokalemia LVH (by echo 2014) Aortic root dilation (on echo 2014) PAD  Prior episode of global transient amnesia in 2009 he was initially placed on antihypertensives.  Prior CT abdomen/pelvis 11/2020 mild atherosclerosis in the iliac arteries.  Amlodipine  previously increased and metoprolol discontinued.  Rosuvastatin  initiated for hyperlipidemia.  He was seen at [facility] to consider renal denervation but it was not performed.  Seen by Dr. Scarce 05/26/2021 with BP 119/80 on regimen of amlodipine , hydrochlorothiazide/triamterene .  Last cardiology clinic visit with Reche Finder, NP on 10/02/22. Feeling well.  Exercising by walking his dog, hiking, and going to [employer].  Following a mostly heart healthy diet.  Enjoys boating with his family in the summer months.  Not monitoring BP at home.  No chest pain, shortness of breath, or other concerning cardiac symptoms.  BP 146/90 with repeat 132/88 without intervention.  He was asked to monitor at home for 2 weeks and send via MyChart.  Plan was to transition to amlodipine -olmesartan if needed.  Echo was ordered to follow-up aortic dilatation.  TTE 10/09/2022 with normal LVEF, mild dilation of ascending aorta 43 mm, mild AI.  Aortic valve was not well-visualized and there was a question of it being bicuspid.  LDL was 108 which was improved from 167 the previous year.  He was encouraged to add Zetia  10 mg daily in addition to rosuvastatin  40 mg daily.  TTE 10/21/2023 with normal LVEF, mild AI, mild dilation ascending aorta 40 mm.  Aortic valve not well-visualized, no aortic stenosis.  History of Present Illness Discussed the use of AI scribe software for clinical note transcription with the patient, who gave verbal  consent to proceed.  History of Present Illness Nathan Ortega is a very pleasant 58 year old male who is here today for follow-up of  hypertension and hyperlipidemia. He has not been using his home blood pressure monitor and is unaware of his current blood pressure readings.  He notes they are well-controlled when he goes to appointments. Lipid panel obtained by PCP several months ago revealed significantly elevated LDL off rosuvastatin .  He resumed and repeat lipid panel 11/26/2023 revealed LDL 54.  He has since discontinued rosuvastatin  due to concerns that it is causing a cough.  Cough did improve once he discontinued it but he does not recall having a cough when he took rosuvastatin  previously. His family history includes his father having myocardial infarctions in his early forties, a triple bypass in 2001, and recent stent placements. His father was a heavy smoker. Patient does not smoke. Both paternal grandparents had poor health, with his grandfather dying in his late fifties. His brother has type 1 diabetes, well-managed with an insulin pump. He is not diabetic. He remains active but admits he does not exercise at a moderate intensity often enough. He denies chest pain, shortness of breath, lower extremity edema, fatigue, palpitations, presyncope, syncope, orthopnea, and PND.   ROS: See HPI  Studies Reviewed EKG Interpretation Date/Time:  Monday December 23 2023 11:22:50 EDT Ventricular Rate:  72 PR Interval:  152 QRS Duration:  94 QT Interval:  416 QTC Calculation: 455 R Axis:   8  Text Interpretation: Normal sinus rhythm Nonspecific T wave abnormality When compared with ECG of 30-Mar-2008 22:45, No significant change was  found Confirmed by Percy Browning [phone]) on 12/23/2023 11:52:12 AM     No results found for: LIPOA  Risk Assessment/Calculations           Physical Exam VS:  BP 130/82   Pulse 72   Ht 5' 10 (1.778 m)   Wt 181 lb (82.1 kg)   SpO2 96%   BMI 25.97  kg/m    Wt Readings from Last 3 Encounters:  12/23/23 181 lb (82.1 kg)  10/02/22 176 lb (79.8 kg)  05/26/21 169 lb 6.4 oz (76.8 kg)    GEN: Well nourished, well developed in no acute distress NECK: No JVD; No carotid bruits CARDIAC: RRR, no murmurs, rubs, gallops RESPIRATORY:  Clear to auscultation without rales, wheezing or rhonchi  ABDOMEN: Soft, non-tender, non-distended EXTREMITIES:  No edema; No deformity   Assessment & Plan Hypertension   Blood pressure is well-controlled. He has not been monitoring routinely at home.  He denies concerning side effects on amlodipine  and Maxide.  Renal function stable on labs completed 10/22/23.  -Encourage routine home blood pressure monitoring  -Continue Maxzide and amlodipine   Hyperlipidemia LDL goal < 70 Aortic atherosclerosis PAD Atherosclerosis noted in iliac arteries on CT 11/2020.  Lipids were elevated on recent labs with PCP and he was advised to resume rosuvastatin  40 mg daily.  He resumed rosuvastatin  and had repeat lipid panel with LDL of 54 on 11/26/2023.  He recently discontinued rosuvastatin  due to possible side effect of cough.  He is willing to retry to see if cough returns while taking rosuvastatin .  Discussed statins' role in plaque stabilization and cardiovascular risk reduction, as well as alternative statins and non-statin therapies.  -Resume rosuvastatin  and monitor for cough -Provide information on lipoprotein A for future testing -Consider alternative statins, bempedoic acid, or PCSK9i if unable to tolerate rosuvastatin   Thoracic aortic aneurysm   Aortic dilatation being monitored annually.  Ascending aorta measuring 40 mm on echo 10/21/2023. No acute concerns today. We will continue annual imaging.  -Repeat echo in one year (already ordered) -Maintain good BP control   Nonspecific T wave abnormality on EKG Cardiac risk   Nonspecific T wave abnormality is consistent with previous findings. He does not have symptoms  concerning for ASCVD.  -Focus on prevention including heart healthy mostly plant based diet avoiding saturated fat, processed foods, simple carbohydrates, and sugar  -Aim for at least 150 minutes of moderate intensity exercise each week.         Dispo: 1 year with Dr. Raford or APP  Signed, Browning Percy, NP-C

## 2023-12-23 ENCOUNTER — Ambulatory Visit (HOSPITAL_BASED_OUTPATIENT_CLINIC_OR_DEPARTMENT_OTHER): Admitting: Nurse Practitioner

## 2023-12-23 ENCOUNTER — Encounter (HOSPITAL_BASED_OUTPATIENT_CLINIC_OR_DEPARTMENT_OTHER): Payer: Self-pay | Admitting: Nurse Practitioner

## 2023-12-23 VITALS — BP 130/82 | HR 72 | Ht 70.0 in | Wt 181.0 lb

## 2023-12-23 DIAGNOSIS — I739 Peripheral vascular disease, unspecified: Secondary | ICD-10-CM

## 2023-12-23 DIAGNOSIS — I10 Essential (primary) hypertension: Secondary | ICD-10-CM | POA: Diagnosis not present

## 2023-12-23 DIAGNOSIS — E785 Hyperlipidemia, unspecified: Secondary | ICD-10-CM

## 2023-12-23 DIAGNOSIS — I7121 Aneurysm of the ascending aorta, without rupture: Secondary | ICD-10-CM | POA: Diagnosis not present

## 2023-12-23 DIAGNOSIS — I700 Atherosclerosis of aorta: Secondary | ICD-10-CM

## 2023-12-23 DIAGNOSIS — R9431 Abnormal electrocardiogram [ECG] [EKG]: Secondary | ICD-10-CM

## 2023-12-23 NOTE — Patient Instructions (Signed)
 Medication Instructions:   Your physician recommends that you continue on your current medications as directed. Please refer to the Current Medication list given to you today.   *If you need a refill on your cardiac medications before your next appointment, please call your pharmacy*  Lab Work:  None ordered.  If you have labs (blood work) drawn today and your tests are completely normal, you will receive your results only by: MyChart Message (if you have MyChart) OR A paper copy in the mail If you have any lab test that is abnormal or we need to change your treatment, we will call you to review the results.  Testing/Procedures:  None ordered.  Follow-Up: At [facility], you and your health needs are our priority.  As part of our continuing mission to provide you with exceptional heart care, our providers are all part of one team.  This team includes your primary Cardiologist (physician) and Advanced Practice Providers or APPs (Physician Assistants and Nurse Practitioners) who all work together to provide you with the care you need, when you need it.  Your next appointment:   1 year(s)  Provider:   Annabella Scarce, MD, Rosaline Bane, NP, or Reche Finder, NP    We recommend signing up for the patient portal called MyChart.  Sign up information is provided on this After Visit Summary.  MyChart is used to connect with patients for Virtual Visits (Telemedicine).  Patients are able to view lab/test results, encounter notes, upcoming appointments, etc.  Non-urgent messages can be sent to your provider as well.   To learn more about what you can do with MyChart, go to [email or web address].   Other Instructions  Your physician wants you to follow-up in: 1 year.  You will receive a reminder letter in the mail two months in advance. If you don't receive a letter, please call our office to schedule the follow-up appointment.     Adopting a Healthy Lifestyle.    Weight: Know what a healthy weight is for you (roughly BMI <25) and aim to maintain this. You can calculate your body mass index on your smart phone. Unfortunately, this is not the most accurate measure of healthy weight, but it is the simplest measurement to use. A more accurate measurement involves body scanning which measures lean muscle, fat tissue and bony density. We do not have this equipment at [facility].    Diet: Aim for 7+ servings of fruits and vegetables daily Limit animal fats in diet for cholesterol and heart health - choose grass fed whenever available Avoid highly processed foods (fast food burgers, tacos, fried chicken, pizza, hot dogs, french fries)  Saturated fat comes in the form of butter, lard, coconut oil, margarine, partially hydrogenated oils, and fat in meat. These increase your risk of cardiovascular disease.  Use healthy plant oils, such as olive, canola, soy, corn, sunflower and peanut.  Whole foods such as fruits, vegetables and whole grains have fiber  Men need > 38 grams of fiber per day Women need > 25 grams of fiber per day  Load up on vegetables and fruits - one-half of your plate: Aim for color and variety, and remember that potatoes dont count. Go for whole grains - one-quarter of your plate: Whole wheat, barley, wheat berries, quinoa, oats, brown rice, and foods made with them. If you want pasta, go with whole wheat pasta. Protein power - one-quarter of your plate: Fish, chicken, beans, and nuts are all healthy, versatile protein sources. Limit red  meat. You need carbohydrates for energy! The type of carbohydrate is more important than the amount. Choose carbohydrates such as vegetables, fruits, whole grains, beans, and nuts in the place of white rice, white pasta, potatoes (baked or fried), macaroni and cheese, cakes, cookies, and donuts.  If youre thirsty, drink water. Coffee and tea are good in moderation, but skip sugary drinks and limit milk and dairy  products to one or two daily servings. Keep sugar intake at 6 teaspoons or 24 grams or LESS       Exercise: Aim for 150 min of moderate intensity exercise weekly for heart health, and weights twice weekly for bone health Stay active - any steps are better than no steps! Aim for 7-9 hours of sleep daily

## 2024-03-02 ENCOUNTER — Other Ambulatory Visit: Payer: Self-pay | Admitting: Family

## 2024-03-02 DIAGNOSIS — I10 Essential (primary) hypertension: Secondary | ICD-10-CM

## 2024-03-24 ENCOUNTER — Other Ambulatory Visit: Payer: Self-pay

## 2024-03-24 DIAGNOSIS — I10 Essential (primary) hypertension: Secondary | ICD-10-CM

## 2024-03-24 MED ORDER — TRIAMTERENE-HCTZ 37.5-25 MG PO TABS
1.0000 | ORAL_TABLET | Freq: Every day | ORAL | 2 refills | Status: AC
Start: 2024-03-24 — End: ?
